# Patient Record
Sex: Male | Born: 1995 | Race: Black or African American | Hispanic: No | Marital: Single | Smoking: Former smoker
Health system: Southern US, Community
[De-identification: ages and names within clinical notes are randomized; demographics above are authoritative.]

## PROBLEM LIST (undated history)

## (undated) ENCOUNTER — Emergency Department (HOSPITAL_COMMUNITY): Discharge status: Absent without leave | Payer: Self-pay

## (undated) DIAGNOSIS — L0291 Cutaneous abscess, unspecified: Secondary | ICD-10-CM

## (undated) DIAGNOSIS — J45909 Unspecified asthma, uncomplicated: Secondary | ICD-10-CM

## (undated) HISTORY — PX: FRACTURE SURGERY: SHX138

---

## 2018-02-13 ENCOUNTER — Emergency Department (HOSPITAL_COMMUNITY)
Admission: EM | Admit: 2018-02-13 | Discharge: 2018-02-13 | Disposition: A | Discharge status: Home / Self Care | Payer: Self-pay | Attending: Emergency Medicine | Admitting: Emergency Medicine

## 2018-02-13 ENCOUNTER — Encounter (HOSPITAL_COMMUNITY): Payer: Self-pay | Admitting: Emergency Medicine

## 2018-02-13 DIAGNOSIS — F17290 Nicotine dependence, other tobacco product, uncomplicated: Secondary | ICD-10-CM | POA: Insufficient documentation

## 2018-02-13 DIAGNOSIS — J40 Bronchitis, not specified as acute or chronic: Secondary | ICD-10-CM | POA: Insufficient documentation

## 2018-02-13 HISTORY — DX: Unspecified asthma, uncomplicated: J45.909

## 2018-02-13 LAB — GROUP A STREP BY PCR: Group A Strep by PCR: NOT DETECTED

## 2018-02-13 MED ORDER — BENZONATATE 100 MG PO CAPS: 100.0000 mg | ORAL_CAPSULE | Freq: Three times a day (TID) | ORAL | 0 refills | Status: DC

## 2018-02-13 MED ORDER — ALBUTEROL SULFATE HFA 108 (90 BASE) MCG/ACT IN AERS
2.0000 | INHALATION_SPRAY | RESPIRATORY_TRACT | Status: DC | PRN
  Administered 2018-02-13: 2 via RESPIRATORY_TRACT
  Filled 2018-02-13: qty 6.7

## 2018-02-13 MED ORDER — AZITHROMYCIN 250 MG PO TABS: 250.0000 mg | ORAL_TABLET | Freq: Every day | ORAL | 0 refills | Status: DC

## 2018-02-13 NOTE — ED Triage Notes (Signed)
Pt c/o congestion, sore throat, cough with mucous since Monday.

## 2018-02-13 NOTE — ED Provider Notes (Signed)
[name]-EMERGENCY DEPT Provider Note   CSN: [id number] Arrival date & time: 02/13/18  1302     History   Chief Complaint Chief Complaint  Patient presents with  . Cough  . Sore Throat  . Nasal Congestion    HPI Drew [name] is a 22 y.o. male with hx of asthma and an every day smoker who presents to the ED with sore throat, cough and congestion that started 4 days ago. Patient reports the symptoms have gotten worse. Patient has taken OTC medications without relief.   The history is provided by the patient. No language interpreter was used.  Cough  This is a new problem. The current episode started more than 2 days ago. The problem has been gradually worsening. The cough is productive of sputum. There has been no fever. Associated symptoms include headaches, rhinorrhea, sore throat, myalgias and wheezing. Pertinent negatives include no chills, no ear pain, no shortness of breath and no eye redness. He has tried decongestants for the symptoms. He is a smoker. His past medical history is significant for asthma.  Sore Throat  This is a new problem. The current episode started more than 2 days ago. The problem occurs constantly. The problem has been gradually worsening. Associated symptoms include headaches. Pertinent negatives include no abdominal pain and no shortness of breath. The symptoms are aggravated by coughing, swallowing and eating. He has tried acetaminophen for the symptoms.    Past Medical History:  Diagnosis Date  . Asthma     There are no active problems to display for this patient.   Past Surgical History:  Procedure Laterality Date  . FRACTURE SURGERY          Home Medications    Prior to Admission medications   Medication Sig Start Date End Date Taking? Authorizing Provider  azithromycin (ZITHROMAX) 250 MG tablet Take 1 tablet (250 mg total) by mouth daily. Take first 2 tablets together, then 1 every day until finished.  02/13/18   Janne Napoleon, NP  benzonatate (TESSALON) 100 MG capsule Take 1 capsule (100 mg total) by mouth every 8 (eight) hours. 02/13/18   Janne Napoleon, NP    Family History No family history on file.  Social History Social History   Tobacco Use  . Smoking status: Current Every Day Smoker    Types: Cigars  . Smokeless tobacco: Never Used  Substance Use Topics  . Alcohol use: Not on file  . Drug use: Not on file     Allergies   Patient has no known allergies.   Review of Systems Review of Systems  Constitutional: Negative for chills.  HENT: Positive for congestion, rhinorrhea, sinus pressure and sore throat. Negative for ear pain and trouble swallowing.   Eyes: Negative for pain, discharge, redness and itching.  Respiratory: Positive for cough and wheezing. Negative for shortness of breath.   Gastrointestinal: Positive for constipation. Negative for abdominal pain, nausea and vomiting.  Genitourinary: Negative for decreased urine volume, dysuria, frequency and urgency.  Musculoskeletal: Positive for myalgias.  Skin: Negative for rash.  Neurological: Positive for headaches. Negative for syncope.  Hematological: Negative for adenopathy.  Psychiatric/Behavioral: Negative for confusion.     Physical Exam Updated Vital Signs BP 140/87 (BP Location: Right Arm)   Pulse 76   Temp 98.1 F (36.7 C) (Oral)   Resp 18   Ht 5' 6.5" (1.689 m)   Wt 70.3 kg (155 lb)   SpO2 100%   BMI 24.64 kg/m  Physical Exam  Constitutional: He appears well-developed and well-nourished. No distress.  HENT:  Head: Normocephalic and atraumatic.  Right Ear: Tympanic membrane normal.  Left Ear: Tympanic membrane normal.  Nose: Mucosal edema and rhinorrhea present. Right sinus exhibits maxillary sinus tenderness. Left sinus exhibits maxillary sinus tenderness.  Mouth/Throat: Uvula is midline and mucous membranes are normal. Posterior oropharyngeal erythema present. No posterior oropharyngeal  edema.  Eyes: Pupils are equal, round, and reactive to light. Conjunctivae and EOM are normal.  Neck: Normal range of motion. Neck supple.  Cardiovascular: Normal rate and regular rhythm.  Pulmonary/Chest: Effort normal. No respiratory distress. Wheezes: occcasional. He exhibits tenderness.  Abdominal: Soft. Bowel sounds are normal. There is no tenderness.  Musculoskeletal: Normal range of motion.  Lymphadenopathy:    He has cervical adenopathy.  Neurological: He is alert.  Skin: Skin is warm and dry.  Psychiatric: He has a normal mood and affect. His behavior is normal.  Nursing note and vitals reviewed.    ED Treatments / Results  Labs (all labs ordered are listed, but only abnormal results are displayed) Labs Reviewed  GROUP A STREP BY PCR    Radiology No results found.  Procedures Procedures (including critical care time)  Medications Ordered in ED Medications  albuterol (PROVENTIL HFA;VENTOLIN HFA) 108 (90 Base) MCG/ACT inhaler 2 puff (has no administration in time range)     Initial Impression / Assessment and Plan / ED Course  I have reviewed the triage vital signs and the nursing notes.  Patients symptoms are consistent with bronchitis. Pt will be discharged with Z-pak and symptomatic treatment.  Verbalizes understanding and is agreeable with plan. Pt is hemodynamically stable & in NAD prior to dc. Encouraged patient to stop smoking.  Final Clinical Impressions(s) / ED Diagnoses   Final diagnoses:  Bronchitis    ED Discharge Orders        Ordered    azithromycin (ZITHROMAX) 250 MG tablet  Daily     02/13/18 1402    benzonatate (TESSALON) 100 MG capsule  Every 8 hours     02/13/18 1402       Kerrie Buffaloeese, Salima Rumer GaltM, NP 02/13/18 1406    Raeford RazorKohut, Stephen, MD 02/16/18 1621

## 2018-04-28 ENCOUNTER — Emergency Department (HOSPITAL_COMMUNITY)
Admission: EM | Admit: 2018-04-28 | Discharge: 2018-04-29 | Disposition: A | Discharge status: Home / Self Care | Payer: Self-pay | Attending: Emergency Medicine | Admitting: Emergency Medicine

## 2018-04-28 ENCOUNTER — Encounter (HOSPITAL_COMMUNITY): Payer: Self-pay | Admitting: Emergency Medicine

## 2018-04-28 ENCOUNTER — Other Ambulatory Visit: Payer: Self-pay

## 2018-04-28 DIAGNOSIS — L0501 Pilonidal cyst with abscess: Secondary | ICD-10-CM | POA: Insufficient documentation

## 2018-04-28 DIAGNOSIS — J45909 Unspecified asthma, uncomplicated: Secondary | ICD-10-CM | POA: Insufficient documentation

## 2018-04-28 NOTE — ED Triage Notes (Signed)
Patient is complaining of abscess on lower back that started two days ago. Patient also states that he has a sharp pain that comes and goes on left leg.

## 2018-04-29 MED ORDER — LIDOCAINE-EPINEPHRINE (PF) 2 %-1:200000 IJ SOLN
10.0000 mL | Freq: Once | INTRAMUSCULAR | Status: AC
  Administered 2018-04-29: 10 mL
  Filled 2018-04-29: qty 20

## 2018-04-29 MED ORDER — OXYCODONE-ACETAMINOPHEN 5-325 MG PO TABS: 1.0000 | ORAL_TABLET | ORAL | 0 refills | Status: DC | PRN

## 2018-04-29 MED ORDER — OXYCODONE-ACETAMINOPHEN 5-325 MG PO TABS
1.0000 | ORAL_TABLET | Freq: Once | ORAL | Status: AC
  Administered 2018-04-29: 1 via ORAL
  Filled 2018-04-29: qty 1

## 2018-04-29 NOTE — ED Provider Notes (Signed)
[facility]-EMERGENCY DEPT Provider Note   CSN: [id number] Arrival date & time: 04/28/18  2134     History   Chief Complaint Chief Complaint  Patient presents with  . Abscess    HPI Drew [name] is a 22 y.o. male.  The history is provided by the patient and the spouse. No language interpreter was used.  Abscess  Location:  Pelvis Pelvic abscess location:  Gluteal cleft Abscess quality: fluctuance, induration, painful and redness   Abscess quality: not draining   Red streaking: no   Duration:  2 days Progression:  Worsening Chronicity:  Recurrent Relieved by:  Nothing Worsened by:  Nothing Associated symptoms: no fever and no nausea     Past Medical History:  Diagnosis Date  . Asthma     There are no active problems to display for this patient.   Past Surgical History:  Procedure Laterality Date  . FRACTURE SURGERY          Home Medications    Prior to Admission medications   Medication Sig Start Date End Date Taking? Authorizing Provider  azithromycin (ZITHROMAX) 250 MG tablet Take 1 tablet (250 mg total) by mouth daily. Take first 2 tablets together, then 1 every day until finished. Patient not taking: Reported on 04/29/2018 02/13/18   Janne Napoleon, NP  benzonatate (TESSALON) 100 MG capsule Take 1 capsule (100 mg total) by mouth every 8 (eight) hours. Patient not taking: Reported on 04/29/2018 02/13/18   Janne Napoleon, NP    Family History History reviewed. No pertinent family history.  Social History Social History   Tobacco Use  . Smoking status: Current Every Day Smoker    Types: Cigars  . Smokeless tobacco: Never Used  Substance Use Topics  . Alcohol use: Not Currently  . Drug use: Not Currently     Allergies   Dairy aid [lactase] and Silver   Review of Systems Review of Systems  Constitutional: Negative for fever.  Gastrointestinal: Negative for nausea.  Musculoskeletal: Positive for back pain.  Skin:  Positive for wound.     Physical Exam Updated Vital Signs BP 124/89 (BP Location: Left Arm)   Pulse 91   Temp 98.2 F (36.8 C) (Oral)   Resp 18   Ht 5' 6.5" (1.689 m)   Wt 72.6 kg   SpO2 100%   BMI 25.44 kg/m   Physical Exam  Constitutional: He is oriented to person, place, and time. He appears well-developed and well-nourished. No distress.  Pulmonary/Chest: Effort normal.  Abdominal: There is no tenderness.  Musculoskeletal: Normal range of motion.  Neurological: He is alert and oriented to person, place, and time.  Skin: There is erythema (Localized to pilonidal abscess).     Swelling, induration, redness and fluctuance at gluteal cleft c/w pilonidal abscess.      ED Treatments / Results  Labs (all labs ordered are listed, but only abnormal results are displayed) Labs Reviewed - No data to display  EKG None  Radiology No results found.  Procedures .Marland KitchenIncision and Drainage Date/Time: 04/29/2018 3:11 AM Performed by: Elpidio Anis, PA-C Authorized by: Elpidio Anis, PA-C   Consent:    Consent obtained:  Verbal   Consent given by:  Patient Location:    Type:  Pilonidal cyst Pre-procedure details:    Skin preparation:  Betadine Procedure type:    Complexity:  Simple Procedure details:    Needle aspiration: no     Incision types:  Single straight   Incision depth:  Subcutaneous   Scalpel blade:  11   Wound management:  Probed and deloculated   Drainage:  Bloody and purulent   Drainage amount:  Copious   Wound treatment:  Wound left open   Packing materials:  1/2 in gauze Post-procedure details:    Patient tolerance of procedure:  Tolerated well, no immediate complications   (including critical care time)  Medications Ordered in ED Medications  lidocaine-EPINEPHrine (XYLOCAINE W/EPI) 2 %-1:200000 (PF) injection 10 mL (10 mLs Infiltration Given by Other 04/29/18 0217)  oxyCODONE-acetaminophen (PERCOCET/ROXICET) 5-325 MG per tablet 1 tablet (1 tablet  Oral Given 04/29/18 0215)     Initial Impression / Assessment and Plan / ED Course  I have reviewed the triage vital signs and the nursing notes.  Pertinent labs & imaging results that were available during my care of the patient were reviewed by me and considered in my medical decision making (see chart for details).     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 I&D successful for copious drainage, relief of pain and packing placed. Will return in 2 days for recheck and packing removal, sooner with any worsening symptoms.   Final Clinical Impressions(s) / ED Diagnoses   Final diagnoses:  None   1. Recurrent pilonidal abscess  ED Discharge Orders    None       Elpidio Anis, PA-C 04/29/18 9622    Derwood Kaplan, MD 04/30/18 [phone]

## 2018-04-30 ENCOUNTER — Other Ambulatory Visit: Payer: Self-pay

## 2018-04-30 ENCOUNTER — Encounter (HOSPITAL_COMMUNITY): Payer: Self-pay

## 2018-04-30 ENCOUNTER — Emergency Department (HOSPITAL_COMMUNITY)
Admission: EM | Admit: 2018-04-30 | Discharge: 2018-04-30 | Disposition: A | Discharge status: Home / Self Care | Payer: Self-pay | Attending: Emergency Medicine | Admitting: Emergency Medicine

## 2018-04-30 DIAGNOSIS — Z09 Encounter for follow-up examination after completed treatment for conditions other than malignant neoplasm: Secondary | ICD-10-CM | POA: Insufficient documentation

## 2018-04-30 DIAGNOSIS — L0501 Pilonidal cyst with abscess: Secondary | ICD-10-CM | POA: Insufficient documentation

## 2018-04-30 DIAGNOSIS — F17290 Nicotine dependence, other tobacco product, uncomplicated: Secondary | ICD-10-CM | POA: Insufficient documentation

## 2018-04-30 DIAGNOSIS — J45909 Unspecified asthma, uncomplicated: Secondary | ICD-10-CM | POA: Insufficient documentation

## 2018-04-30 MED ORDER — HYDROCODONE-ACETAMINOPHEN 5-325 MG PO TABS
1.0000 | ORAL_TABLET | Freq: Once | ORAL | Status: AC
  Administered 2018-04-30: 1 via ORAL
  Filled 2018-04-30: qty 1

## 2018-04-30 MED ORDER — SULFAMETHOXAZOLE-TRIMETHOPRIM 800-160 MG PO TABS
1.0000 | ORAL_TABLET | Freq: Once | ORAL | Status: AC
  Administered 2018-04-30: 1 via ORAL
  Filled 2018-04-30: qty 1

## 2018-04-30 MED ORDER — SULFAMETHOXAZOLE-TRIMETHOPRIM 800-160 MG PO TABS: 1.0000 | ORAL_TABLET | Freq: Two times a day (BID) | ORAL | 0 refills | Status: AC

## 2018-04-30 NOTE — ED Provider Notes (Signed)
[facility]-EMERGENCY DEPT Provider Note   CSN: [id number] Arrival date & time: 04/30/18  1504     History   Chief Complaint Chief Complaint  Patient presents with  . Abscess    HPI Drew [name] is a 22 y.o. male who presents to the ED for wound check. Patient reports that he had I&D of an abscess yesterday and today the area is very painful and the drainage is coming through the bandage. Patient denies fever or chills.   HPI  Past Medical History:  Diagnosis Date  . Asthma     There are no active problems to display for this patient.   Past Surgical History:  Procedure Laterality Date  . FRACTURE SURGERY          Home Medications    Prior to Admission medications   Medication Sig Start Date End Date Taking? Authorizing Provider  azithromycin (ZITHROMAX) 250 MG tablet Take 1 tablet (250 mg total) by mouth daily. Take first 2 tablets together, then 1 every day until finished. Patient not taking: Reported on 04/29/2018 02/13/18   Janne Napoleon, NP  benzonatate (TESSALON) 100 MG capsule Take 1 capsule (100 mg total) by mouth every 8 (eight) hours. Patient not taking: Reported on 04/29/2018 02/13/18   Janne Napoleon, NP  oxyCODONE-acetaminophen (PERCOCET/ROXICET) 5-325 MG tablet Take 1 tablet by mouth every 4 (four) hours as needed for severe pain. 04/29/18   Elpidio Anis, PA-C  sulfamethoxazole-trimethoprim (BACTRIM DS,SEPTRA DS) 800-160 MG tablet Take 1 tablet by mouth 2 (two) times daily for 7 days. 04/30/18 05/07/18  Janne Napoleon, NP    Family History Family History  Adopted: Yes    Social History Social History   Tobacco Use  . Smoking status: Light Tobacco Smoker    Types: Cigars  . Smokeless tobacco: Never Used  Substance Use Topics  . Alcohol use: Not Currently  . Drug use: Not Currently     Allergies   Dairy aid [lactase]; Silver; and Chocolate   Review of Systems Review of Systems  Skin: Positive for wound.       Pain  and drainage   All other systems reviewed and are negative.    Physical Exam Updated Vital Signs BP (!) 139/98 (BP Location: Right Arm)   Pulse 87   Temp 98.9 F (37.2 C) (Oral)   Resp 14   Ht 5' 6.5" (1.689 m)   Wt 72.5 kg   SpO2 99%   BMI 25.41 kg/m   Physical Exam  Constitutional: He appears well-developed and well-nourished. No distress.  HENT:  Head: Normocephalic.  Eyes: Conjunctivae and EOM are normal.  Neck: Neck supple.  Cardiovascular: Normal rate.  Pulmonary/Chest: Effort normal.  Musculoskeletal: Normal range of motion.  Neurological: He is alert.  Skin: Skin is warm and dry.  Large amount of drainage from the incision site of pilonidal cyst. Packing in place. There is an area of redness above the wound approximately 2 cm. The area is tender with palpation.  Psychiatric: He has a normal mood and affect.  Nursing note and vitals reviewed.    ED Treatments / Results  Labs (all labs ordered are listed, but only abnormal results are displayed) Labs Reviewed - No data to display Radiology No results found.  Procedures: area surrounding the abscess cleaned and dressing applied.  Procedures (including critical care time)  Medications Ordered in ED Medications  HYDROcodone-acetaminophen (NORCO/VICODIN) 5-325 MG per tablet 1 tablet (has no administration in time range)  sulfamethoxazole-trimethoprim (BACTRIM DS,SEPTRA DS) 800-160 MG per tablet 1 tablet (has no administration in time range)     Initial Impression / Assessment and Plan / ED Course  I have reviewed the triage vital signs and the nursing notes. 22 y.o. male here for wound recheck and increased pain and drainage and area of redness stable for d/c without fever and does not appear toxic. Will start antibiotics due to the area of erythema. Patient will return for packing removal as scheduled.   Final Clinical Impressions(s) / ED Diagnoses   Final diagnoses:  Encounter for recheck of abscess  following incision and drainage    ED Discharge Orders         Ordered    sulfamethoxazole-trimethoprim (BACTRIM DS,SEPTRA DS) 800-160 MG tablet  2 times daily     04/30/18 1800           Damian Leavell [place], NP 04/30/18 1810    Melene Plan, DO 04/30/18 2301

## 2018-04-30 NOTE — ED Triage Notes (Signed)
Patient reports that he had an abscess drained from the tailbone yesterday and today the drainage is worse and has a foul odor. Patient also reports that he was prescribed a pain med but can not get it filled until tomorrow due to no money. Patient also c/o left leg pain which he had yesterday.

## 2018-04-30 NOTE — Discharge Instructions (Addendum)
Take Advil in addition to the medications we give you. Return as scheduled for packing removal and recheck.

## 2018-05-01 ENCOUNTER — Other Ambulatory Visit: Payer: Self-pay

## 2018-05-01 ENCOUNTER — Emergency Department (HOSPITAL_COMMUNITY)
Admission: EM | Admit: 2018-05-01 | Discharge: 2018-05-01 | Disposition: A | Discharge status: Home / Self Care | Payer: Self-pay | Attending: Emergency Medicine | Admitting: Emergency Medicine

## 2018-05-01 ENCOUNTER — Encounter (HOSPITAL_COMMUNITY): Payer: Self-pay | Admitting: Emergency Medicine

## 2018-05-01 DIAGNOSIS — R030 Elevated blood-pressure reading, without diagnosis of hypertension: Secondary | ICD-10-CM | POA: Insufficient documentation

## 2018-05-01 DIAGNOSIS — F17290 Nicotine dependence, other tobacco product, uncomplicated: Secondary | ICD-10-CM | POA: Insufficient documentation

## 2018-05-01 DIAGNOSIS — Z5189 Encounter for other specified aftercare: Secondary | ICD-10-CM

## 2018-05-01 DIAGNOSIS — L0501 Pilonidal cyst with abscess: Secondary | ICD-10-CM | POA: Insufficient documentation

## 2018-05-01 MED ORDER — LIDOCAINE-EPINEPHRINE (PF) 2 %-1:200000 IJ SOLN
20.0000 mL | Freq: Once | INTRAMUSCULAR | Status: AC
  Administered 2018-05-01: 20 mL
  Filled 2018-05-01: qty 20

## 2018-05-01 NOTE — ED Triage Notes (Signed)
Pt wants to get abscess in buttocks checked. Thinks it needs to be drained again. Unsure if had anymore drainage since last night when wife last changed his dressing. Reports still having pain. Pt wouldn't sit while being triaged.

## 2018-05-01 NOTE — ED Provider Notes (Signed)
[facility]-EMERGENCY DEPT Provider Note   CSN: [id number] Arrival date & time: 05/01/18  1738     History   Chief Complaint Chief Complaint  Patient presents with  . Abscess    HPI Karen Sotto is a 22 y.o. male.  HPI  Patient is a 22 year old male with a history of asthma presenting for wound check.  Patient had an incision and drainage of a pilonidal abscess on 9- 3- 2019, was prescribed antibiotics on 04-30-2018, and presents for recheck today.  Patient reporting sensation of increased pressure and pain around the area, and there has been minimal drainage.  Patient denies doing warm compresses, but does report he is taking 1 dose of the antibiotic.  Patient denies any fevers, chills, or spreading erythema around the site.  Past Medical History:  Diagnosis Date  . Asthma     There are no active problems to display for this patient.   Past Surgical History:  Procedure Laterality Date  . FRACTURE SURGERY          Home Medications    Prior to Admission medications   Medication Sig Start Date End Date Taking? Authorizing Provider  azithromycin (ZITHROMAX) 250 MG tablet Take 1 tablet (250 mg total) by mouth daily. Take first 2 tablets together, then 1 every day until finished. Patient not taking: Reported on 04/29/2018 02/13/18   Janne Napoleon, NP  benzonatate (TESSALON) 100 MG capsule Take 1 capsule (100 mg total) by mouth every 8 (eight) hours. Patient not taking: Reported on 04/29/2018 02/13/18   Janne Napoleon, NP  oxyCODONE-acetaminophen (PERCOCET/ROXICET) 5-325 MG tablet Take 1 tablet by mouth every 4 (four) hours as needed for severe pain. 04/29/18   Elpidio Anis, PA-C  sulfamethoxazole-trimethoprim (BACTRIM DS,SEPTRA DS) 800-160 MG tablet Take 1 tablet by mouth 2 (two) times daily for 7 days. 04/30/18 05/07/18  Janne Napoleon, NP    Family History Family History  Adopted: Yes    Social History Social History   Tobacco Use  . Smoking  status: Light Tobacco Smoker    Types: Cigars  . Smokeless tobacco: Never Used  Substance Use Topics  . Alcohol use: Not Currently  . Drug use: Not Currently     Allergies   Dairy aid [lactase]; Silver; and Chocolate   Review of Systems Review of Systems  Constitutional: Negative for chills and fever.  Skin: Positive for wound. Negative for color change.     Physical Exam Updated Vital Signs BP (!) 144/102 (BP Location: Right Arm)   Pulse 92   Temp 98.5 F (36.9 C) (Oral)   Resp 16   Ht 5' 6.5" (1.689 m)   Wt 72.6 kg   SpO2 99%   BMI 25.44 kg/m   Physical Exam  Constitutional: He appears well-developed and well-nourished. No distress.  Sitting comfortably in bed.  HENT:  Head: Normocephalic and atraumatic.  Eyes: Conjunctivae are normal. Right eye exhibits no discharge. Left eye exhibits no discharge.  EOMs normal to gross examination.  Neck: Normal range of motion.  Cardiovascular: Normal rate and regular rhythm.  Intact, 2+ radial pulse.  Pulmonary/Chest:  Normal respiratory effort. Patient converses comfortably. No audible wheeze or stridor.  Abdominal: He exhibits no distension.  Genitourinary:  Genitourinary Comments: Examination performed with RN chaperone present.  There is a well-healing incision with packing extruding from the incision just to the right of midline of the gluteal cleft.  Small amount of purulent drainage coming from the packing.  Musculoskeletal:  Normal range of motion.  Neurological: He is alert.  Cranial nerves intact to gross observation. Patient moves extremities without difficulty.  Skin: Skin is warm and dry. He is not diaphoretic.  Psychiatric: He has a normal mood and affect. His behavior is normal. Judgment and thought content normal.  Nursing note and vitals reviewed.    ED Treatments / Results  Labs (all labs ordered are listed, but only abnormal results are displayed) Labs Reviewed - No data to  display  EKG None  Radiology No results found.  Procedures Irrigation Date/Time: 05/01/2018 9:39 PM Performed by: Elisha Ponder, PA-C Authorized by: Elisha Ponder, PA-C  Consent: Verbal consent obtained. Risks and benefits: risks, benefits and alternatives were discussed Consent given by: patient Patient understanding: patient states understanding of the procedure being performed Required items: required blood products, implants, devices, and special equipment available Patient identity confirmed: verbally with patient Local anesthesia used: yes Anesthesia: local infiltration  Anesthesia: Local anesthesia used: yes Local Anesthetic: lidocaine 1% with epinephrine  Sedation: Patient sedated: no  Patient tolerance: Patient tolerated the procedure well with no immediate complications Comments: Incision was extended to 1 cm after local anesthesia.  Packing was removed, wound was irrigated with normal saline.  Patient had blood clot as well as serous fluid expressed, but minimal purulence.  Wound repacked. Assisted by Rene Kocher, nurse tech as chaperone.    (including critical care time)  Medications Ordered in ED Medications  lidocaine-EPINEPHrine (XYLOCAINE W/EPI) 2 %-1:200000 (PF) injection 20 mL (20 mLs Infiltration Given 05/01/18 1858)     Initial Impression / Assessment and Plan / ED Course  I have reviewed the triage vital signs and the nursing notes.  Pertinent labs & imaging results that were available during my care of the patient were reviewed by me and considered in my medical decision making (see chart for details).     Patient nontoxic-appearing, afebrile, in no acute distress.  Patient does not appear to have expanding cellulitis from his pilonidal abscess.  Patient did have what appeared to be seroma after removal of the packing.  Wound was repacked and recommended patient follow-up for recheck in 48 hours.  Patient instructed to continue taking his  antibiotics.  Return precautions were given increasing pain or redness spreading from the site.  Patient is in understanding and agrees with the plan of care.  Final Clinical Impressions(s) / ED Diagnoses   Final diagnoses:  Pilonidal abscess  Wound check, abscess    ED Discharge Orders    None       Delia Chimes 05/01/18 2142    Pricilla Loveless, MD 05/01/18 [phone]

## 2018-05-01 NOTE — Discharge Instructions (Signed)
Please see the information and instructions below regarding your visit.  Your diagnoses today include:  1. Pilonidal abscess   2. Wound check, abscess   3. Elevated blood pressure reading without diagnosis of hypertension     Abscesses form when an infection in your skin starts to collect bacteria and white blood cells, walling it off from the rest of your body to protect you from a bigger infection. Risk factors for this type of infection include:  ?Break in the skin ?Diabetes ?Swollen areas  Sometimes the infection starts to spread to surrounding tissue, causing redness and swelling. We call this cellulitis.   Tests performed today include: See side panel of your discharge paperwork for testing performed today. Vital signs are listed at the bottom of these instructions.   Medications prescribed:    Take any prescribed medications only as prescribed, and any over the counter medications only as directed on the packaging.  Continue your antibiotic.  Home care instructions:  Please follow any educational materials contained in this packet.   Some things that may promote healing of your wound and infection include:  Perform a sitz bath, where you sit in a clean tub of warm water for approximately 20 minutes 3 times daily. Keep the infected area clean and dry. You can take a shower or bath, but be sure to pat the area dry with a towel afterward. Do not put any antibiotic ointments or creams on the area. Reapply a dry gauze dressing any time the bandage has become soaked with drainage, or after cleansing the wound.  Apply warm compresses to the wound 3-4 times daily to encourage drainage.   Return instructions:  Please return to the Emergency Department if you experience worsening symptoms. You should return for reevaluation of your infection if you notice spreading redness, increased swelling, an abscess develops, or you develop signs and symptoms of a systemic illness such as  fever and chills.  Please return if you have any other emergent concerns.  Additional Information:   Your vital signs today were: BP (!) 144/102 (BP Location: Right Arm)    Pulse 92    Temp 98.5 F (36.9 C) (Oral)    Resp 16    Ht 5' 6.5" (1.689 m)    Wt 72.6 kg    SpO2 99%    BMI 25.44 kg/m  If your blood pressure (BP) was elevated on multiple readings during this visit above 130 for the top number or above 80 for the bottom number, please have this repeated by your primary care provider within one month. --------------  Thank you for allowing [place] to participate in your care today.

## 2018-05-03 ENCOUNTER — Other Ambulatory Visit: Payer: Self-pay

## 2018-05-03 ENCOUNTER — Emergency Department (HOSPITAL_COMMUNITY)
Admission: EM | Admit: 2018-05-03 | Discharge: 2018-05-03 | Disposition: A | Discharge status: Home / Self Care | Payer: Self-pay | Attending: Emergency Medicine | Admitting: Emergency Medicine

## 2018-05-03 ENCOUNTER — Encounter (HOSPITAL_COMMUNITY): Payer: Self-pay | Admitting: Emergency Medicine

## 2018-05-03 DIAGNOSIS — Z09 Encounter for follow-up examination after completed treatment for conditions other than malignant neoplasm: Secondary | ICD-10-CM

## 2018-05-03 DIAGNOSIS — Z4800 Encounter for change or removal of nonsurgical wound dressing: Secondary | ICD-10-CM | POA: Insufficient documentation

## 2018-05-03 DIAGNOSIS — J45909 Unspecified asthma, uncomplicated: Secondary | ICD-10-CM | POA: Insufficient documentation

## 2018-05-03 DIAGNOSIS — F17210 Nicotine dependence, cigarettes, uncomplicated: Secondary | ICD-10-CM | POA: Insufficient documentation

## 2018-05-03 HISTORY — DX: Cutaneous abscess, unspecified: L02.91

## 2018-05-03 NOTE — Discharge Instructions (Signed)
Continue taking the antibiotic, until finished.  Antiinflammatory medications: Take 600 mg of ibuprofen every 6 hours or 440 mg (over the counter dose) to 500 mg (prescription dose) of naproxen every 12 hours for the next 3 days. After this time, these medications may be used as needed for pain. Take these medications with food to avoid upset stomach. Choose only one of these medications, do not take them together. Acetaminophen (generic for Tylenol): Should you continue to have additional pain while taking the ibuprofen or naproxen, you may add in acetaminophen as needed. Your daily total maximum amount of acetaminophen from all sources should be limited to 4000mg /day for persons without liver problems, or 2000mg /day for those with liver problems.  May apply warm or cool compresses, depending on what feels the best at this point.  Follow-up with general surgery for definitive management of this issue.

## 2018-05-03 NOTE — ED Triage Notes (Addendum)
Pt reports he has a abscess on the buttocks, he had is lanced at [facility] on Weds and Friday. He arrives today stating the packing came out when his wife changed the dressing earlier today. Gauze with serosanguinous drainage noted. Pt also requesting a new prescription for pain pill.

## 2018-05-03 NOTE — ED Provider Notes (Signed)
[facility] EMERGENCY DEPARTMENT Provider Note   CSN: [id number] Arrival date & time: 05/03/18  1522     History   Chief Complaint Chief Complaint  Patient presents with  . Abscess    HPI Drew [name] is a 22 y.o. male.  HPI   Drew [name] is a 22 y.o. male, with a history of asthma, presenting to the ED for recheck of pilonidal abscess.  Abscess was originally drained in the ED on September 3.  He was started on Bactrim on September 5 and states he has been compliant.  On September 6, he was still having sensation of pressure and increased pain, as well as purulent discharge.  His wound was irrigated and repacked. He presents today because he was told to follow-up in 2 days. He still has pain in the region, but no swelling and discharge has resolved.  Symptoms have overall improved. Denies fever/chills, spreading pain, spreading redness, abdominal pain, or any other complaints.   Past Medical History:  Diagnosis Date  . Abscess   . Asthma     There are no active problems to display for this patient.   Past Surgical History:  Procedure Laterality Date  . FRACTURE SURGERY          Home Medications    Prior to Admission medications   Medication Sig Start Date End Date Taking? Authorizing Provider  azithromycin (ZITHROMAX) 250 MG tablet Take 1 tablet (250 mg total) by mouth daily. Take first 2 tablets together, then 1 every day until finished. Patient not taking: Reported on 04/29/2018 02/13/18   Janne Napoleon, NP  benzonatate (TESSALON) 100 MG capsule Take 1 capsule (100 mg total) by mouth every 8 (eight) hours. Patient not taking: Reported on 04/29/2018 02/13/18   Janne Napoleon, NP  oxyCODONE-acetaminophen (PERCOCET/ROXICET) 5-325 MG tablet Take 1 tablet by mouth every 4 (four) hours as needed for severe pain. 04/29/18   Elpidio Anis, PA-C  sulfamethoxazole-trimethoprim (BACTRIM DS,SEPTRA DS) 800-160 MG tablet Take 1 tablet by mouth 2  (two) times daily for 7 days. 04/30/18 05/07/18  Janne Napoleon, NP    Family History Family History  Adopted: Yes    Social History Social History   Tobacco Use  . Smoking status: Light Tobacco Smoker    Types: Cigars  . Smokeless tobacco: Never Used  Substance Use Topics  . Alcohol use: Not Currently  . Drug use: Not Currently     Allergies   Dairy aid [lactase]; Silver; and Chocolate   Review of Systems Review of Systems  Constitutional: Negative for chills and fever.  Gastrointestinal: Negative for abdominal pain, nausea and vomiting.  Skin: Positive for wound.     Physical Exam Updated Vital Signs BP 132/87 (BP Location: Right Arm)   Pulse 81   Temp 98.7 F (37.1 C) (Oral)   Resp 20   SpO2 99%   Physical Exam  Constitutional: He appears well-developed and well-nourished. No distress.  HENT:  Head: Normocephalic and atraumatic.  Eyes: Conjunctivae are normal.  Neck: Neck supple.  Cardiovascular: Normal rate and regular rhythm.  Pulmonary/Chest: Effort normal.  Musculoskeletal:       Back:  Neurological: He is alert.  Skin: Skin is warm and dry. He is not diaphoretic. No pallor.  Psychiatric: He has a normal mood and affect. His behavior is normal.  Nursing note and vitals reviewed.    ED Treatments / Results  Labs (all labs ordered are listed, but only abnormal results are  displayed) Labs Reviewed - No data to display  EKG None  Radiology No results found.  Procedures Procedures (including critical care time)  Medications Ordered in ED Medications - No data to display   Initial Impression / Assessment and Plan / ED Course  I have reviewed the triage vital signs and the nursing notes.  Pertinent labs & imaging results that were available during my care of the patient were reviewed by me and considered in my medical decision making (see chart for details).     Patient presents for recheck of I&D site.  He has some tenderness in the  region, but no active discharge.  No evidence of cellulitis.  No fluctuance or swelling to suggest recurrence of the abscess.  Since this is the second abscess patient has had in the region, instructions for general surgery follow-up were reiterated.    Final Clinical Impressions(s) / ED Diagnoses   Final diagnoses:  Encounter for recheck of abscess following incision and drainage    ED Discharge Orders    None       Concepcion Living 05/03/18 1702    Linwood Dibbles, MD 05/05/18 [phone]

## 2018-05-03 NOTE — ED Notes (Signed)
Patient able to ambulate independently  

## 2018-08-28 ENCOUNTER — Other Ambulatory Visit: Payer: Self-pay

## 2018-08-28 ENCOUNTER — Encounter (HOSPITAL_COMMUNITY): Payer: Self-pay | Admitting: Emergency Medicine

## 2018-08-28 ENCOUNTER — Emergency Department (HOSPITAL_COMMUNITY)
Admission: EM | Admit: 2018-08-28 | Discharge: 2018-08-28 | Disposition: A | Discharge status: Home / Self Care | Payer: Self-pay | Attending: Emergency Medicine | Admitting: Emergency Medicine

## 2018-08-28 DIAGNOSIS — F17210 Nicotine dependence, cigarettes, uncomplicated: Secondary | ICD-10-CM | POA: Insufficient documentation

## 2018-08-28 DIAGNOSIS — L0501 Pilonidal cyst with abscess: Secondary | ICD-10-CM | POA: Insufficient documentation

## 2018-08-28 DIAGNOSIS — J45909 Unspecified asthma, uncomplicated: Secondary | ICD-10-CM | POA: Insufficient documentation

## 2018-08-28 MED ORDER — LIDOCAINE-EPINEPHRINE (PF) 2 %-1:200000 IJ SOLN
10.0000 mL | Freq: Once | INTRAMUSCULAR | Status: AC
  Administered 2018-08-28: 10 mL
  Filled 2018-08-28: qty 20

## 2018-08-28 MED ORDER — SULFAMETHOXAZOLE-TRIMETHOPRIM 800-160 MG PO TABS: 1.0000 | ORAL_TABLET | Freq: Two times a day (BID) | ORAL | 0 refills | Status: AC

## 2018-08-28 MED ORDER — OXYCODONE-ACETAMINOPHEN 5-325 MG PO TABS
1.0000 | ORAL_TABLET | Freq: Once | ORAL | Status: AC
  Administered 2018-08-28: 1 via ORAL
  Filled 2018-08-28: qty 1

## 2018-08-28 NOTE — ED Triage Notes (Signed)
C/o pilonidal abscess x 3 weeks.  Denies fever/chills.  History of same.

## 2018-08-28 NOTE — ED Provider Notes (Signed)
[facility] EMERGENCY DEPARTMENT Provider Note   CSN: [id number] Arrival date & time: 08/28/18  1840     History   Chief Complaint Chief Complaint  Patient presents with  . Abscess    HPI Drew [name] is a 23 y.o. male with past medical history of asthma, pilonidal abscess, presenting to the emergency department with worsening pilonidal abscess x3 weeks.  Patient states he had the last abscess drained in September with resolution, however a few weeks ago he started noticing some soreness to that area.  He has had worsening redness and swelling to the area.  Has not taken any medications for his symptoms.  Denies associated fevers or chills.  It has not drained.  The history is provided by the patient.    Past Medical History:  Diagnosis Date  . Abscess   . Asthma     There are no active problems to display for this patient.   Past Surgical History:  Procedure Laterality Date  . FRACTURE SURGERY          Home Medications    Prior to Admission medications   Medication Sig Start Date End Date Taking? Authorizing Provider  azithromycin (ZITHROMAX) 250 MG tablet Take 1 tablet (250 mg total) by mouth daily. Take first 2 tablets together, then 1 every day until finished. Patient not taking: Reported on 04/29/2018 02/13/18   Janne Napoleon, NP  benzonatate (TESSALON) 100 MG capsule Take 1 capsule (100 mg total) by mouth every 8 (eight) hours. Patient not taking: Reported on 04/29/2018 02/13/18   Janne Napoleon, NP  oxyCODONE-acetaminophen (PERCOCET/ROXICET) 5-325 MG tablet Take 1 tablet by mouth every 4 (four) hours as needed for severe pain. 04/29/18   Elpidio Anis, PA-C  sulfamethoxazole-trimethoprim (BACTRIM DS,SEPTRA DS) 800-160 MG tablet Take 1 tablet by mouth 2 (two) times daily for 7 days. 08/28/18 09/04/18  Eveleen Mcnear, [place] N, PA-C    Family History Family History  Adopted: Yes    Social History Social History   Tobacco Use  . Smoking status:  Light Tobacco Smoker    Types: Cigars, Cigarettes  . Smokeless tobacco: Never Used  Substance Use Topics  . Alcohol use: Not Currently  . Drug use: Not Currently     Allergies   Dairy aid [lactase]; Silver; and Chocolate   Review of Systems Review of Systems  Constitutional: Negative for chills and fever.  Skin: Positive for color change.     Physical Exam Updated Vital Signs BP (!) 143/92 (BP Location: Right Arm)   Pulse 77   Temp 98.2 F (36.8 C) (Oral)   Resp 18   SpO2 100%   Physical Exam Vitals signs and nursing note reviewed.  Constitutional:      Appearance: He is well-developed. He is not ill-appearing or toxic-appearing.  HENT:     Head: Normocephalic and atraumatic.  Eyes:     Conjunctiva/sclera: Conjunctivae normal.  Cardiovascular:     Rate and Rhythm: Normal rate.  Pulmonary:     Effort: Pulmonary effort is normal.  Skin:    Comments: Erythematous indurated area about 4inches in diameter, in the center is ~3 inch area of fluctuance just above the gluteal cleft, right of the midline. Fluctuance tracts towards the gluteal cleft, right of the midline. Area is very tender. Not actively draining.   Neurological:     Mental Status: He is alert.  Psychiatric:        Mood and Affect: Mood normal.  Behavior: Behavior normal.      ED Treatments / Results  Labs (all labs ordered are listed, but only abnormal results are displayed) Labs Reviewed - No data to display  EKG None  Radiology No results found.  Procedures .Marland Kitchen.Incision and Drainage Date/Time: 08/28/2018 11:07 PM Performed by: Florence Yeung, [place] N, PA-C Authorized by: Wilmina Maxham, [place] N, PA-C   Consent:    Consent obtained:  Verbal   Consent given by:  Patient   Risks discussed:  Bleeding, incomplete drainage, infection and pain   Alternatives discussed:  No treatment Location:    Type:  Pilonidal cyst (pilonidal abscess)   Size:  3"   Location:  Anogenital   Anogenital location:   Pilonidal Pre-procedure details:    Skin preparation:  Chloraprep Anesthesia (see MAR for exact dosages):    Anesthesia method:  Local infiltration   Local anesthetic:  Lidocaine 2% WITH epi Procedure type:    Complexity:  Simple Procedure details:    Needle aspiration: no     Incision types:  Single straight   Incision depth:  Dermal   Scalpel blade:  11   Wound management:  Probed and deloculated and irrigated with saline   Drainage:  Purulent and bloody   Drainage amount:  Copious   Wound treatment:  Wound left open   Packing materials:  1/4 in iodoform gauze Post-procedure details:    Patient tolerance of procedure:  Tolerated well, no immediate complications Comments:     Repacked with 1/4 inch idoform gauze (1/2 inch gauze was not available at this time)   (including critical care time)  Medications Ordered in ED Medications  oxyCODONE-acetaminophen (PERCOCET/ROXICET) 5-325 MG per tablet 1 tablet (1 tablet Oral Given 08/28/18 2131)  lidocaine-EPINEPHrine (XYLOCAINE W/EPI) 2 %-1:200000 (PF) injection 10 mL (10 mLs Infiltration Given 08/28/18 2147)     Initial Impression / Assessment and Plan / ED Course  I have reviewed the triage vital signs and the nursing notes.  Pertinent labs & imaging results that were available during my care of the patient were reviewed by me and considered in my medical decision making (see chart for details).     Patient with pilonidal abscess amenable to incision and drainage.  Abscess was packed with 1/4 inch iodoform (1/2" not available at this time).  No systemic symptoms, no fever or chills. Wound recheck and packing removal in 2 days. Encouraged home warm compresses.  Given surrounding cellulitis, will discharge with Bactrim.  Will d/c to home.    Discussed results, findings, treatment and follow up. Patient advised of return precautions. Patient verbalized understanding and agreed with plan.  Final Clinical Impressions(s) / ED Diagnoses     Final diagnoses:  Pilonidal abscess    ED Discharge Orders         Ordered    sulfamethoxazole-trimethoprim (BACTRIM DS,SEPTRA DS) 800-160 MG tablet  2 times daily     08/28/18 2259           Brick Ketcher, [place] N, PA-C 08/28/18 2310    Virgina Norfolkuratolo, Adam, DO 08/29/18 [phone]

## 2018-08-28 NOTE — Discharge Instructions (Addendum)
Return to the ED in 2 days for wound recheck and packing removal.  Take the antibiotics as prescribed until gone. Apply warm compresses to your wound multiple times per day. Do not soak it, since you have packing in place. Take ibuprofen every 6 hours as needed for pain.  Return sooner for fever, chills, or new or worsening symptoms. Schedule an appointment with the surgeon to discuss definitive treatment.

## 2018-08-30 ENCOUNTER — Other Ambulatory Visit: Payer: Self-pay

## 2018-08-30 ENCOUNTER — Encounter (HOSPITAL_COMMUNITY): Payer: Self-pay

## 2018-08-30 ENCOUNTER — Emergency Department (HOSPITAL_COMMUNITY)
Admission: EM | Admit: 2018-08-30 | Discharge: 2018-08-30 | Disposition: A | Discharge status: Home / Self Care | Payer: Self-pay | Attending: Emergency Medicine | Admitting: Emergency Medicine

## 2018-08-30 DIAGNOSIS — Z5189 Encounter for other specified aftercare: Secondary | ICD-10-CM

## 2018-08-30 DIAGNOSIS — Z4800 Encounter for change or removal of nonsurgical wound dressing: Secondary | ICD-10-CM | POA: Insufficient documentation

## 2018-08-30 DIAGNOSIS — J45909 Unspecified asthma, uncomplicated: Secondary | ICD-10-CM | POA: Insufficient documentation

## 2018-08-30 DIAGNOSIS — F17210 Nicotine dependence, cigarettes, uncomplicated: Secondary | ICD-10-CM | POA: Insufficient documentation

## 2018-08-30 MED ORDER — SULFAMETHOXAZOLE-TRIMETHOPRIM 800-160 MG PO TABS
1.0000 | ORAL_TABLET | Freq: Once | ORAL | Status: AC
  Administered 2018-08-30: 1 via ORAL
  Filled 2018-08-30: qty 1

## 2018-08-30 MED ORDER — IBUPROFEN 400 MG PO TABS
600.0000 mg | ORAL_TABLET | Freq: Once | ORAL | Status: AC
  Administered 2018-08-30: 600 mg via ORAL
  Filled 2018-08-30: qty 1

## 2018-08-30 NOTE — Discharge Instructions (Signed)
Please pick up and begin taking your antibiotics tomorrow.  Ibuprofen and Tylenol as needed for pain.  Warm soaks can also help with pain and promote drainage.  Return for packing removal in 2 days if you have any signs of increasing redness, drainage, pain, fevers or any other new or concerning symptoms.

## 2018-08-30 NOTE — ED Provider Notes (Signed)
[name] EMERGENCY DEPARTMENT Provider Note   CSN: [id number] Arrival date & time: 08/30/18  1437     History   Chief Complaint Chief Complaint  Patient presents with  . Packing Removal    HPI Drew [name] is a 23 y.o. male.  Drew [name] is a 23 y.o. male history of asthma, who presents to the emergency department for evaluation of wound check.  Patient reports that he was seen here in the emergency department 2 days previously and had a pilonidal abscess drained.  The wound was packed and he was instructed to return in 2 days for packing removal.  Patient was prescribed Bactrim, but reports he has not been able to fill this prescription due to financial issues, but is planning to pick it up tomorrow after he gets paid.  He reports he is continued to have some drainage from the area as well as pain, but his wife has been changing dressing caring for the area and reports improving redness and decrease in the amount of drainage.  He has not had any fevers or chills.  Reports he still has some discomfort with walking and is concerned about going back to work tomorrow.     Past Medical History:  Diagnosis Date  . Abscess   . Asthma     There are no active problems to display for this patient.   Past Surgical History:  Procedure Laterality Date  . FRACTURE SURGERY          Home Medications    Prior to Admission medications   Medication Sig Start Date End Date Taking? Authorizing Provider  azithromycin (ZITHROMAX) 250 MG tablet Take 1 tablet (250 mg total) by mouth daily. Take first 2 tablets together, then 1 every day until finished. Patient not taking: Reported on 04/29/2018 02/13/18   Janne Napoleon, NP  benzonatate (TESSALON) 100 MG capsule Take 1 capsule (100 mg total) by mouth every 8 (eight) hours. Patient not taking: Reported on 04/29/2018 02/13/18   Janne Napoleon, NP  oxyCODONE-acetaminophen (PERCOCET/ROXICET) 5-325 MG tablet Take 1  tablet by mouth every 4 (four) hours as needed for severe pain. 04/29/18   Elpidio Anis, PA-C  sulfamethoxazole-trimethoprim (BACTRIM DS,SEPTRA DS) 800-160 MG tablet Take 1 tablet by mouth 2 (two) times daily for 7 days. 08/28/18 09/04/18  Robinson, [place] N, PA-C    Family History Family History  Adopted: Yes    Social History Social History   Tobacco Use  . Smoking status: Light Tobacco Smoker    Types: Cigars, Cigarettes  . Smokeless tobacco: Never Used  Substance Use Topics  . Alcohol use: Not Currently  . Drug use: Not Currently     Allergies   Dairy aid [lactase]; Silver; and Chocolate   Review of Systems Review of Systems  Constitutional: Negative for chills and fever.  Skin: Positive for color change and wound (Abscess).     Physical Exam Updated Vital Signs BP (!) 135/99 (BP Location: Right Arm)   Pulse (!) 104   Temp 98.6 F (37 C)   Resp 18   Ht 5\' 6"  (1.676 m)   Wt 72.6 kg   SpO2 100%   BMI 25.82 kg/m   Physical Exam Vitals signs and nursing note reviewed.  Constitutional:      General: He is not in acute distress.    Appearance: He is well-developed. He is not diaphoretic.  HENT:     Head: Normocephalic and atraumatic.  Eyes:  General:        Right eye: No discharge.        Left eye: No discharge.  Pulmonary:     Effort: Pulmonary effort is normal. No respiratory distress.  Genitourinary:    Comments: Previously drained pilonidal abscess noted with 2 cm incision with packing present, minimal surrounding erythema no palpable induration, packing removed with moderate amount of drainage absorbed in the packing, and small amount of expressible will drainage after removal Skin:    General: Skin is warm and dry.  Neurological:     Mental Status: He is alert and oriented to person, place, and time. Mental status is at baseline.     Coordination: Coordination normal.  Psychiatric:        Mood and Affect: Mood normal.        Behavior: Behavior  normal.      ED Treatments / Results  Labs (all labs ordered are listed, but only abnormal results are displayed) Labs Reviewed - No data to display  EKG None  Radiology No results found.  Procedures Wound packing Date/Time: 08/30/2018 4:28 PM Performed by: Dartha Lodge, PA-C Authorized by: Dartha Lodge, PA-C  Consent: Verbal consent obtained. Consent given by: patient Patient understanding: patient states understanding of the procedure being performed Patient identity confirmed: verbally with patient Preparation: Patient was prepped and draped in the usual sterile fashion. Local anesthesia used: no  Anesthesia: Local anesthesia used: no  Sedation: Patient sedated: no  Patient tolerance: Patient tolerated the procedure well with no immediate complications Comments: Packing from pilonidal cyst drained 2 days ago removed and there was still some expressible drainage present so wound was repacked with quarter inch iodoform gauze, tolerated well by patient    (including critical care time)  Medications Ordered in ED Medications  ibuprofen (ADVIL,MOTRIN) tablet 600 mg (has no administration in time range)  sulfamethoxazole-trimethoprim (BACTRIM DS,SEPTRA DS) 800-160 MG per tablet 1 tablet (has no administration in time range)     Initial Impression / Assessment and Plan / ED Course  I have reviewed the triage vital signs and the nursing notes.  Pertinent labs & imaging results that were available during my care of the patient were reviewed by me and considered in my medical decision making (see chart for details).  Pt with abscess seen two days ago with I&D. Chart reviewed in EPIC. Pt has been doing a very good job of taking care of the site at home.  Has not been able to pick up his antibiotics yet but was prescribed Bactrim and plans to pick it up tomorrow.  The site is healthy appearing, with improving redness, packing removed with moderate amount of absorbed  purulent drainage, there was still a small amount of purulent drainage expressible so wound was repacked.  No surrounding cellulitis and no palpable fluctuance or induration indicating deeper infection or residual abscess.  Encouraged warm soaks.  Patient given dose of antibiotics here and will pick up his prescription tomorrow.  Discussed home care and return precautions, pt advised to continue all antibiotics until they are gone.  Patient to return in 2 days for packing removal.    Final Clinical Impressions(s) / ED Diagnoses   Final diagnoses:  Abscess re-check    ED Discharge Orders    None       Legrand Rams 08/30/18 1632    Doug Sou, MD 08/30/18 1900

## 2018-08-30 NOTE — ED Triage Notes (Signed)
Pt arrives POV for packing removal from pilonidal cyst that was drained on Friday. Pt states that he has not been taking abx since Friday.

## 2018-09-12 ENCOUNTER — Encounter (HOSPITAL_COMMUNITY): Payer: Self-pay | Admitting: Emergency Medicine

## 2018-09-12 ENCOUNTER — Emergency Department (HOSPITAL_COMMUNITY)
Admission: EM | Admit: 2018-09-12 | Discharge: 2018-09-13 | Disposition: A | Discharge status: Home / Self Care | Payer: Self-pay | Attending: Emergency Medicine | Admitting: Emergency Medicine

## 2018-09-12 DIAGNOSIS — R197 Diarrhea, unspecified: Secondary | ICD-10-CM | POA: Insufficient documentation

## 2018-09-12 DIAGNOSIS — F17210 Nicotine dependence, cigarettes, uncomplicated: Secondary | ICD-10-CM | POA: Insufficient documentation

## 2018-09-12 DIAGNOSIS — R112 Nausea with vomiting, unspecified: Secondary | ICD-10-CM | POA: Insufficient documentation

## 2018-09-12 DIAGNOSIS — R55 Syncope and collapse: Secondary | ICD-10-CM | POA: Insufficient documentation

## 2018-09-12 DIAGNOSIS — J45909 Unspecified asthma, uncomplicated: Secondary | ICD-10-CM | POA: Insufficient documentation

## 2018-09-12 LAB — URINALYSIS, ROUTINE W REFLEX MICROSCOPIC
Bilirubin Urine: NEGATIVE
Glucose, UA: NEGATIVE mg/dL
Hgb urine dipstick: NEGATIVE
Ketones, ur: NEGATIVE mg/dL
Leukocytes, UA: NEGATIVE
Nitrite: NEGATIVE
Protein, ur: NEGATIVE mg/dL
SPECIFIC GRAVITY, URINE: 1.016 (ref 1.005–1.030)
pH: 6 (ref 5.0–8.0)

## 2018-09-12 LAB — CBC
HCT: 46.9 % (ref 39.0–52.0)
Hemoglobin: 16.1 g/dL (ref 13.0–17.0)
MCH: 28.2 pg (ref 26.0–34.0)
MCHC: 34.3 g/dL (ref 30.0–36.0)
MCV: 82.1 fL (ref 80.0–100.0)
Platelets: 237 10*3/uL (ref 150–400)
RBC: 5.71 MIL/uL (ref 4.22–5.81)
RDW: 13.9 % (ref 11.5–15.5)
WBC: 11.9 10*3/uL — ABNORMAL HIGH (ref 4.0–10.5)
nRBC: 0 % (ref 0.0–0.2)

## 2018-09-12 LAB — COMPREHENSIVE METABOLIC PANEL
ALT: 25 U/L (ref 0–44)
AST: 22 U/L (ref 15–41)
Albumin: 3.8 g/dL (ref 3.5–5.0)
Alkaline Phosphatase: 55 U/L (ref 38–126)
Anion gap: 8 (ref 5–15)
BUN: 7 mg/dL (ref 6–20)
CHLORIDE: 102 mmol/L (ref 98–111)
CO2: 25 mmol/L (ref 22–32)
Calcium: 8.8 mg/dL — ABNORMAL LOW (ref 8.9–10.3)
Creatinine, Ser: 1.05 mg/dL (ref 0.61–1.24)
GFR calc Af Amer: >60 mL/min (ref >60)
GFR calc non Af Amer: >60 mL/min (ref >60)
Glucose, Bld: 131 mg/dL — ABNORMAL HIGH (ref 70–99)
Potassium: 4 mmol/L (ref 3.5–5.1)
SODIUM: 135 mmol/L (ref 135–145)
Total Bilirubin: 0.4 mg/dL (ref 0.3–1.2)
Total Protein: 6.5 g/dL (ref 6.5–8.1)

## 2018-09-12 LAB — POC OCCULT BLOOD, ED: FECAL OCCULT BLD: NEGATIVE

## 2018-09-12 LAB — TYPE AND SCREEN
ABO/RH(D): AB POS
Antibody Screen: NEGATIVE

## 2018-09-12 LAB — LIPASE, BLOOD: Lipase: 40 U/L (ref 11–51)

## 2018-09-12 MED ORDER — ONDANSETRON HCL 4 MG/2ML IJ SOLN
4.0000 mg | Freq: Once | INTRAMUSCULAR | Status: AC
  Administered 2018-09-12: 4 mg via INTRAVENOUS
  Filled 2018-09-12: qty 2

## 2018-09-12 MED ORDER — DICYCLOMINE HCL 20 MG PO TABS: 20.0000 mg | ORAL_TABLET | Freq: Two times a day (BID) | ORAL | 0 refills | Status: DC

## 2018-09-12 MED ORDER — SODIUM CHLORIDE 0.9 % IV BOLUS
1000.0000 mL | Freq: Once | INTRAVENOUS | Status: AC
  Administered 2018-09-12: 1000 mL via INTRAVENOUS

## 2018-09-12 MED ORDER — ONDANSETRON HCL 4 MG PO TABS: 4.0000 mg | ORAL_TABLET | Freq: Three times a day (TID) | ORAL | 0 refills | Status: DC | PRN

## 2018-09-12 MED ORDER — SODIUM CHLORIDE 0.9% FLUSH: 3.0000 mL | Freq: Once | INTRAVENOUS | Status: DC

## 2018-09-12 NOTE — ED Provider Notes (Signed)
[facility]   CSN: [id number] Arrival date & time: 09/12/18  2126     History   Chief Complaint Chief Complaint  Patient presents with  . Abdominal Pain  . Hematemesis    HPI Drew [name] is a 23 y.o. male.  The history is provided by the patient and the spouse. No language interpreter was used.  Abdominal Pain  Pain location:  Periumbilical Pain quality: cramping   Pain radiates to:  Does not radiate Pain severity:  Mild Onset quality:  Gradual Duration:  8 hours Timing:  Constant Progression:  Improving Chronicity:  New Context: not alcohol use, not awakening from sleep, not diet changes, not eating, not laxative use, not medication withdrawal, not previous surgeries, not recent illness, not recent sexual activity, not recent travel, not retching, not sick contacts and not suspicious food intake   Relieved by:  None tried Worsened by:  Nothing Ineffective treatments:  None tried Associated symptoms: hematemesis, melena and vomiting   Associated symptoms: no chest pain, no chills, no constipation, no cough, no diarrhea, no dysuria, no fatigue, no fever, no flatus, no hematochezia, no hematuria, no nausea, no shortness of breath and no sore throat   Associated symptoms comment:  2 episodes of vomiting with small streaks of blood on the 2nd episode 2 episodes of watery diarrhea that he describes as black Denies hx of PUD or chronic abd pain Risk factors: no alcohol abuse, no aspirin use, not elderly, has not had multiple surgeries, no NSAID use, not obese and no recent hospitalization   Loss of Consciousness  Episode history:  Single Most recent episode:  Today Chronicity:  New Context comment:  Vomiting Witnessed: yes   Associated symptoms: rectal bleeding, vomiting and weakness   Associated symptoms: no chest pain, no confusion, no difficulty breathing, no dizziness, no fever, no nausea, no palpitations, no  recent fall, no recent injury, no seizures, no shortness of breath and no visual change    The patient presents with cc of the abdominal pain.  He had 2 episodes of vomiting.  The first was normal vomitus, the second was vomitus that had light streaky blood.  Patient also complains of several episodes of diarrhea.  He has that he noticed that his stool is black.  He denies a history of peptic ulcer disease or chronic abdominal pain.  Apparently when the patient had an episode of vomiting he passed out.  He hit his head on the wall the bathroom.  He is complaining of a slight headache.  He is never had a episode of syncope in the past.  Was witnessed by his wife.  He was in a seated position on hands and knees on the floor over the toilet when this occurred.  He felt dizziness prodrome.  Past Medical History:  Diagnosis Date  . Abscess   . Asthma     There are no active problems to display for this patient.   Past Surgical History:  Procedure Laterality Date  . FRACTURE SURGERY          Home Medications    Prior to Admission medications   Medication Sig Start Date End Date Taking? Authorizing Provider  azithromycin (ZITHROMAX) 250 MG tablet Take 1 tablet (250 mg total) by mouth daily. Take first 2 tablets together, then 1 every day until finished. Patient not taking: Reported on 04/29/2018 02/13/18   Janne NapoleonNeese, Hope M, NP  benzonatate (TESSALON) 100 MG capsule Take 1 capsule (  100 mg total) by mouth every 8 (eight) hours. Patient not taking: Reported on 04/29/2018 02/13/18   Janne NapoleonNeese, Hope M, NP  oxyCODONE-acetaminophen (PERCOCET/ROXICET) 5-325 MG tablet Take 1 tablet by mouth every 4 (four) hours as needed for severe pain. 04/29/18   Elpidio AnisUpstill, Shari, PA-C    Family History Family History  Adopted: Yes    Social History Social History   Tobacco Use  . Smoking status: Light Tobacco Smoker    Types: Cigars, Cigarettes  . Smokeless tobacco: Never Used  Substance Use Topics  . Alcohol use: Not  Currently  . Drug use: Not Currently     Allergies   Dairy aid [lactase]; Silver; and Chocolate   Review of Systems Review of Systems  Constitutional: Negative for chills, fatigue and fever.  HENT: Negative for sore throat.   Respiratory: Negative for cough and shortness of breath.   Cardiovascular: Positive for syncope. Negative for chest pain and palpitations.  Gastrointestinal: Positive for abdominal pain, hematemesis, melena and vomiting. Negative for constipation, diarrhea, flatus, hematochezia and nausea.  Genitourinary: Negative for dysuria and hematuria.  Musculoskeletal: Negative for myalgias.  Neurological: Positive for syncope and weakness. Negative for dizziness and seizures.  Psychiatric/Behavioral: Negative for confusion.  All other systems reviewed and are negative.    Physical Exam Updated Vital Signs BP 121/74   Pulse 62   Resp 18   Ht 5' 6.5" (1.689 m)   Wt 70.3 kg   SpO2 98%   BMI 24.64 kg/m   Physical Exam Vitals signs and nursing note reviewed.  Constitutional:      General: He is not in acute distress.    Appearance: He is well-developed. He is not diaphoretic.  HENT:     Head: Normocephalic and atraumatic.  Eyes:     General: No scleral icterus.    Conjunctiva/sclera: Conjunctivae normal.  Neck:     Musculoskeletal: Normal range of motion and neck supple.  Cardiovascular:     Rate and Rhythm: Normal rate and regular rhythm.     Heart sounds: Normal heart sounds.  Pulmonary:     Effort: Pulmonary effort is normal. No respiratory distress.     Breath sounds: Normal breath sounds.  Abdominal:     Palpations: Abdomen is soft.     Tenderness: There is no abdominal tenderness. There is no guarding.  Genitourinary:    Comments: Digital Rectal Exam reveals sphincter with good tone. No external hemorrhoids. No masses or fissures. Stool color is brown with no overt blood.  Skin:    General: Skin is warm and dry.  Neurological:     Mental  Status: He is alert.     Comments: Speech is clear and goal oriented, follows commands Major Cranial nerves without deficit, no facial droop Normal strength in upper and lower extremities bilaterally including dorsiflexion and plantar flexion, strong and equal grip strength Sensation normal to light and sharp touch Moves extremities without ataxia, coordination intact Normal finger to nose and rapid alternating movements Neg romberg, no pronator drift Normal gait Normal heel-shin and balance   Psychiatric:        Behavior: Behavior normal.      ED Treatments / Results  Labs (all labs ordered are listed, but only abnormal results are displayed) Labs Reviewed  CBC - Abnormal; Notable for the following components:      Result Value   WBC 11.9 (*)    All other components within normal limits  LIPASE, BLOOD  COMPREHENSIVE METABOLIC PANEL  URINALYSIS,  ROUTINE W REFLEX MICROSCOPIC  POC OCCULT BLOOD, ED  TYPE AND SCREEN    EKG None  Radiology No results found.  Procedures Procedures (including critical care time)  Medications Ordered in ED Medications  sodium chloride flush (NS) 0.9 % injection 3 mL (3 mLs Intravenous Not Given 09/12/18 2140)  sodium chloride 0.9 % bolus 1,000 mL (1,000 mLs Intravenous New Bag/Given 09/12/18 2140)     Initial Impression / Assessment and Plan / ED Course  I have reviewed the triage vital signs and the nursing notes.  Pertinent labs & imaging results that were available during my care of the patient were reviewed by me and considered in my medical decision making (see chart for details).     24 year old male who presents with nausea vomiting and diarrhea, single syncopal episode.  He has no nausea or vomiting here in the emergency department, no diarrhea, normal rectal exam without evidence of melena.  Hemoglobin is normal.  Negative orthostatic vital signs.  Believe the patient likely had a vasovagal syncope episode during his second  episode of vomiting or could have possibly been did due to the site of blood.  He has stable throughout his ER visit.  Reviewed the patient's EKG which shows no abnormal arrhythmias, WPW, Brugada.  Patient be discharged with Bentyl and Zofran.  Discussed outpatient follow-up and return precautions.  Appears appropriate for discharge at this time  Final Clinical Impressions(s) / ED Diagnoses   Final diagnoses:  Nausea vomiting and diarrhea  Syncope, vasovagal    ED Discharge Orders    None       Arthor Captain, PA-C 09/13/18 0012    Rolan Bucco, MD 09/13/18 [id number]

## 2018-09-12 NOTE — ED Triage Notes (Signed)
Reports waking up with abdominal pain and diarrhea with black tarry stool then reports vomit having streaks of blood.  Reports abdominal cramping in center of belly.  Also reports passing out and hitting his head.  Per spouse found on floor in the bathroom.

## 2018-09-12 NOTE — Discharge Instructions (Addendum)
Your entire work-up was normal.  There is no blood in your stool.  Believe your episode of passing out was due to what is called vasovagal syncope.  This occurs sometimes during episodes of straining or vomiting due to pressure on the vagus nerve.  I am discharging you with medication for stomach cramping and vomiting.  You may take Imodium, Kaopectate, or Pepto-Bismol to slow your diarrhea.  Make sure to stay well-hydrated.  Return if you have a seizure, have high fever have intractable vomiting or have repeat episodes of passing out

## 2018-09-13 LAB — ABO/RH: ABO/RH(D): AB POS

## 2018-09-13 NOTE — ED Notes (Signed)
Pt discharged from ED; instructions provided and scripts given; Pt encouraged to return to ED if symptoms worsen and to f/u with PCP; Pt verbalized understanding of all instructions 

## 2018-10-28 ENCOUNTER — Other Ambulatory Visit: Payer: Self-pay

## 2018-10-28 ENCOUNTER — Emergency Department (HOSPITAL_COMMUNITY)
Admission: EM | Admit: 2018-10-28 | Discharge: 2018-10-29 | Discharge status: Against Medical Advice | Payer: Self-pay | Attending: Emergency Medicine | Admitting: Emergency Medicine

## 2018-10-28 ENCOUNTER — Encounter (HOSPITAL_COMMUNITY): Payer: Self-pay | Admitting: Emergency Medicine

## 2018-10-28 DIAGNOSIS — Z5321 Procedure and treatment not carried out due to patient leaving prior to being seen by health care provider: Secondary | ICD-10-CM | POA: Insufficient documentation

## 2018-10-28 NOTE — ED Triage Notes (Signed)
Pt c/o headache and right sided facial pain that started this morning. Denies nausea/sensitivity to light and sound. No known hx of migraines.

## 2018-10-29 MED ORDER — IBUPROFEN 400 MG PO TABS
400.0000 mg | ORAL_TABLET | Freq: Once | ORAL | Status: AC | PRN
  Administered 2018-10-29: 400 mg via ORAL
  Filled 2018-10-29: qty 1

## 2018-10-29 NOTE — ED Notes (Signed)
Pt c/o increased pain.

## 2018-10-31 ENCOUNTER — Emergency Department (HOSPITAL_COMMUNITY)
Admission: EM | Admit: 2018-10-31 | Discharge: 2018-11-01 | Disposition: A | Discharge status: Home / Self Care | Payer: Self-pay | Attending: Emergency Medicine | Admitting: Emergency Medicine

## 2018-10-31 ENCOUNTER — Other Ambulatory Visit: Payer: Self-pay

## 2018-10-31 ENCOUNTER — Emergency Department (HOSPITAL_COMMUNITY): Payer: Self-pay

## 2018-10-31 ENCOUNTER — Encounter (HOSPITAL_COMMUNITY): Payer: Self-pay | Admitting: *Deleted

## 2018-10-31 DIAGNOSIS — R55 Syncope and collapse: Secondary | ICD-10-CM

## 2018-10-31 DIAGNOSIS — R42 Dizziness and giddiness: Secondary | ICD-10-CM | POA: Insufficient documentation

## 2018-10-31 DIAGNOSIS — Z720 Tobacco use: Secondary | ICD-10-CM | POA: Insufficient documentation

## 2018-10-31 DIAGNOSIS — J45909 Unspecified asthma, uncomplicated: Secondary | ICD-10-CM | POA: Insufficient documentation

## 2018-10-31 DIAGNOSIS — R69 Illness, unspecified: Secondary | ICD-10-CM

## 2018-10-31 DIAGNOSIS — J111 Influenza due to unidentified influenza virus with other respiratory manifestations: Secondary | ICD-10-CM | POA: Insufficient documentation

## 2018-10-31 LAB — BASIC METABOLIC PANEL
Anion gap: 7 (ref 5–15)
BUN: 9 mg/dL (ref 6–20)
CALCIUM: 9.2 mg/dL (ref 8.9–10.3)
CO2: 27 mmol/L (ref 22–32)
Chloride: 106 mmol/L (ref 98–111)
Creatinine, Ser: 0.85 mg/dL (ref 0.61–1.24)
GFR calc Af Amer: >60 mL/min (ref >60)
GFR calc non Af Amer: >60 mL/min (ref >60)
Glucose, Bld: 90 mg/dL (ref 70–99)
POTASSIUM: 4 mmol/L (ref 3.5–5.1)
Sodium: 140 mmol/L (ref 135–145)

## 2018-10-31 LAB — RAPID URINE DRUG SCREEN, HOSP PERFORMED
Amphetamines: NOT DETECTED
Barbiturates: NOT DETECTED
Benzodiazepines: NOT DETECTED
Cocaine: NOT DETECTED
Opiates: NOT DETECTED
Tetrahydrocannabinol: POSITIVE — AB

## 2018-10-31 LAB — URINALYSIS, ROUTINE W REFLEX MICROSCOPIC
Bilirubin Urine: NEGATIVE
Glucose, UA: NEGATIVE mg/dL
Hgb urine dipstick: NEGATIVE
KETONES UR: NEGATIVE mg/dL
Leukocytes,Ua: NEGATIVE
Nitrite: NEGATIVE
Protein, ur: NEGATIVE mg/dL
Specific Gravity, Urine: 1.011 (ref 1.005–1.030)
pH: 7 (ref 5.0–8.0)

## 2018-10-31 LAB — CBC WITH DIFFERENTIAL/PLATELET
Abs Immature Granulocytes: 0.08 10*3/uL — ABNORMAL HIGH (ref 0.00–0.07)
Basophils Absolute: 0.1 10*3/uL (ref 0.0–0.1)
Basophils Relative: 1 %
EOS ABS: 0.2 10*3/uL (ref 0.0–0.5)
Eosinophils Relative: 1 %
HCT: 48.2 % (ref 39.0–52.0)
Hemoglobin: 16.4 g/dL (ref 13.0–17.0)
Immature Granulocytes: 1 %
Lymphocytes Relative: 22 %
Lymphs Abs: 2.7 10*3/uL (ref 0.7–4.0)
MCH: 28 pg (ref 26.0–34.0)
MCHC: 34 g/dL (ref 30.0–36.0)
MCV: 82.4 fL (ref 80.0–100.0)
Monocytes Absolute: 0.7 10*3/uL (ref 0.1–1.0)
Monocytes Relative: 5 %
Neutro Abs: 8.8 10*3/uL — ABNORMAL HIGH (ref 1.7–7.7)
Neutrophils Relative %: 70 %
Platelets: 372 10*3/uL (ref 150–400)
RBC: 5.85 MIL/uL — ABNORMAL HIGH (ref 4.22–5.81)
RDW: 13.8 % (ref 11.5–15.5)
WBC: 12.5 10*3/uL — ABNORMAL HIGH (ref 4.0–10.5)
nRBC: 0 % (ref 0.0–0.2)

## 2018-10-31 MED ORDER — DIPHENHYDRAMINE HCL 50 MG/ML IJ SOLN
25.0000 mg | Freq: Once | INTRAMUSCULAR | Status: AC
  Administered 2018-10-31: 25 mg via INTRAVENOUS
  Filled 2018-10-31: qty 1

## 2018-10-31 MED ORDER — PROCHLORPERAZINE EDISYLATE 10 MG/2ML IJ SOLN
10.0000 mg | Freq: Once | INTRAMUSCULAR | Status: AC
  Administered 2018-10-31: 10 mg via INTRAVENOUS
  Filled 2018-10-31: qty 2

## 2018-10-31 MED ORDER — SODIUM CHLORIDE 0.9 % IV BOLUS
1000.0000 mL | Freq: Once | INTRAVENOUS | Status: AC
  Administered 2018-10-31: 1000 mL via INTRAVENOUS

## 2018-10-31 MED ORDER — ONDANSETRON 4 MG PO TBDP: ORAL_TABLET | ORAL | 0 refills | Status: DC

## 2018-10-31 MED ORDER — KETOROLAC TROMETHAMINE 30 MG/ML IJ SOLN
30.0000 mg | Freq: Once | INTRAMUSCULAR | Status: AC
  Administered 2018-10-31: 30 mg via INTRAVENOUS
  Filled 2018-10-31: qty 1

## 2018-10-31 NOTE — ED Triage Notes (Signed)
Pt seen @ [facility] for h/a.  Per [facility], c/o h/a & flu-like symptoms, productive cough.

## 2018-10-31 NOTE — ED Notes (Signed)
Pt. Ambulated in hallway on RA. O2 saturations measured 97-98%.

## 2018-10-31 NOTE — ED Provider Notes (Signed)
[facility]-EMERGENCY DEPT Provider Note   CSN: [id number] Arrival date & time: 10/31/18  1423    History   Chief Complaint Chief Complaint  Patient presents with  . flu like symptoms    HPI Drew [name] is a 23 y.o. male.     Drew [name] is a 23 y.o. male with a history of asthma, who presents to the emergency department for evaluation of headache, cough, congestion, chills, body aches and lightheadedness.  Patient reports he has been getting increasing headaches over the past week, was seen at [facility] ED 1 week ago and they gave him shots for his headache which improved it but it is come back over the past few days.  He denies any associated vision changes, facial asymmetry, numbness weakness or tingling.  No associated nausea or vomiting.  No dizziness.  Headaches are gradual in onset, and this is not the worst headache of his life.  He has not had any associated fevers, does report some chills but is also had a persistent productive cough over the past week as well as some nasal congestion, rhinorrhea and mild sore throat.  He reports he has not had much of an appetite and has not had much to eat or drink over the past few days.  And reports that he today he is felt frequently like he is going to pass out.  He reports that earlier he was sitting down and then seemed to lose consciousness briefly.  His girlfriend reports she was with him throughout the day and he did not exhibit any seizure-like activity.  Patient does report marijuana use early this morning, typically uses marijuana daily, denies other substance use.  He has not done anything to treat his symptoms prior to arrival, unsure of any sick contacts, no other aggravating or alleviating factors.     Past Medical History:  Diagnosis Date  . Abscess   . Asthma     There are no active problems to display for this patient.   Past Surgical History:  Procedure Laterality Date  .  FRACTURE SURGERY          Home Medications    Prior to Admission medications   Medication Sig Start Date End Date Taking? Authorizing Provider  azithromycin (ZITHROMAX) 250 MG tablet Take 1 tablet (250 mg total) by mouth daily. Take first 2 tablets together, then 1 every day until finished. Patient not taking: Reported on 04/29/2018 02/13/18   Janne Napoleon, NP  benzonatate (TESSALON) 100 MG capsule Take 1 capsule (100 mg total) by mouth every 8 (eight) hours. Patient not taking: Reported on 04/29/2018 02/13/18   Janne Napoleon, NP  dicyclomine (BENTYL) 20 MG tablet Take 1 tablet (20 mg total) by mouth 2 (two) times daily. Patient not taking: Reported on 10/31/2018 09/12/18   Arthor Captain, PA-C  ondansetron [facility] ODT) 4 MG disintegrating tablet 4mg  ODT q4 hours prn nausea/vomit 10/31/18   Dartha Lodge, PA-C  ondansetron (ZOFRAN) 4 MG tablet Take 1 tablet (4 mg total) by mouth every 8 (eight) hours as needed for nausea or vomiting. Patient not taking: Reported on 10/31/2018 09/12/18   Arthor Captain, PA-C  oxyCODONE-acetaminophen (PERCOCET/ROXICET) 5-325 MG tablet Take 1 tablet by mouth every 4 (four) hours as needed for severe pain. Patient not taking: Reported on 09/12/2018 04/29/18   Elpidio Anis, PA-C    Family History Family History  Adopted: Yes    Social History Social History   Tobacco Use  .  Smoking status: Light Tobacco Smoker    Types: Cigars, Cigarettes  . Smokeless tobacco: Never Used  Substance Use Topics  . Alcohol use: Not Currently  . Drug use: Not Currently     Allergies   Dairy aid [lactase]; Silver; and Chocolate   Review of Systems Review of Systems  Constitutional: Positive for chills. Negative for fever.  HENT: Positive for congestion, rhinorrhea and sore throat.   Respiratory: Positive for cough. Negative for shortness of breath.   Cardiovascular: Negative for chest pain.  Gastrointestinal: Negative for abdominal pain, diarrhea, nausea and vomiting.    Genitourinary: Negative for dysuria and frequency.  Musculoskeletal: Negative for arthralgias, myalgias, neck pain and neck stiffness.  Skin: Negative for color change and rash.  Neurological: Positive for syncope, light-headedness and headaches. Negative for dizziness, speech difficulty, weakness and numbness.     Physical Exam Updated Vital Signs BP 129/85 (BP Location: Left Arm)   Pulse 74   Temp 98 F (36.7 C) (Oral)   Resp 18   Ht 5' 6.5" (1.689 m)   Wt 71.7 kg   SpO2 99%   BMI 25.12 kg/m   Physical Exam Vitals signs and nursing note reviewed.  Constitutional:      General: He is not in acute distress.    Appearance: Normal appearance. He is well-developed. He is not ill-appearing or diaphoretic.  HENT:     Head: Normocephalic and atraumatic.     Right Ear: Tympanic membrane and ear canal normal.     Left Ear: Tympanic membrane and ear canal normal.     Nose: Congestion and rhinorrhea present.     Comments: Bilateral nares patent with moderate mucosal edema and clear rhinorrhea present.     Mouth/Throat:     Mouth: Mucous membranes are moist.     Pharynx: Oropharynx is clear.     Comments: Posterior oropharynx clear and mucous membranes moist, there is mild erythema but no edema or tonsillar exudates, uvula midline, normal phonation, no trismus, tolerating secretions without difficulty. Eyes:     General:        Right eye: No discharge.        Left eye: No discharge.     Pupils: Pupils are equal, round, and reactive to light.  Neck:     Musculoskeletal: Neck supple.  Cardiovascular:     Rate and Rhythm: Normal rate and regular rhythm.     Pulses: Normal pulses.     Heart sounds: Normal heart sounds. No murmur. No friction rub. No gallop.   Pulmonary:     Effort: Pulmonary effort is normal. No respiratory distress.     Breath sounds: Normal breath sounds. No wheezing or rales.     Comments: Respirations equal and unlabored, patient able to speak in full  sentences, lungs clear to auscultation bilaterally Abdominal:     General: Abdomen is flat. Bowel sounds are normal. There is no distension.     Palpations: Abdomen is soft. There is no mass.     Tenderness: There is no abdominal tenderness. There is no guarding.     Comments: Abdomen soft, nondistended, nontender to palpation in all quadrants without guarding or peritoneal signs  Musculoskeletal:        General: No deformity.     Right lower leg: No edema.     Left lower leg: No edema.  Skin:    General: Skin is warm and dry.     Capillary Refill: Capillary refill takes less than 2 seconds.  Neurological:     Mental Status: He is alert and oriented to person, place, and time.     Coordination: Coordination normal.     Comments: Speech is clear, able to follow commands CN III-XII intact Normal strength in upper and lower extremities bilaterally including dorsiflexion and plantar flexion, strong and equal grip strength Sensation normal to light and sharp touch Moves extremities without ataxia, coordination intact   Psychiatric:        Mood and Affect: Mood normal.        Behavior: Behavior normal.      ED Treatments / Results  Labs (all labs ordered are listed, but only abnormal results are displayed) Labs Reviewed  CBC WITH DIFFERENTIAL/PLATELET - Abnormal; Notable for the following components:      Result Value   WBC 12.5 (*)    RBC 5.85 (*)    Neutro Abs 8.8 (*)    Abs Immature Granulocytes 0.08 (*)    All other components within normal limits  RAPID URINE DRUG SCREEN, HOSP PERFORMED - Abnormal; Notable for the following components:   Tetrahydrocannabinol POSITIVE (*)    All other components within normal limits  BASIC METABOLIC PANEL  URINALYSIS, ROUTINE W REFLEX MICROSCOPIC    EKG EKG Interpretation  Date/Time:  Saturday October 31 2018 21:31:25 EST Ventricular Rate:  63 PR Interval:    QRS Duration: 83 QT Interval:  436 QTC Calculation: 447 R  Axis:   110 Text Interpretation:  Sinus rhythm Borderline right axis deviation no sig change from previous Confirmed by Arby Barrette [phone]) on 10/31/2018 10:44:04 PM   Radiology Dg Chest 2 View  Result Date: 10/31/2018 CLINICAL DATA:  Cough EXAM: CHEST - 2 VIEW COMPARISON:  None. FINDINGS: The heart size and mediastinal contours are within normal limits. Both lungs are clear. The visualized skeletal structures are unremarkable. IMPRESSION: No active cardiopulmonary disease. Electronically Signed   By: Tollie Eth M.D.   On: 10/31/2018 20:30    Procedures Procedures (including critical care time)  Medications Ordered in ED Medications  sodium chloride 0.9 % bolus 1,000 mL (0 mLs Intravenous Stopped 10/31/18 2358)  ketorolac (TORADOL) 30 MG/ML injection 30 mg (30 mg Intravenous Given 10/31/18 2049)  prochlorperazine (COMPAZINE) injection 10 mg (10 mg Intravenous Given 10/31/18 2049)  diphenhydrAMINE (BENADRYL) injection 25 mg (25 mg Intravenous Given 10/31/18 2049)     Initial Impression / Assessment and Plan / ED Course  I have reviewed the triage vital signs and the nursing notes.  Pertinent labs & imaging results that were available during my care of the patient were reviewed by me and considered in my medical decision making (see chart for details).  Patient presents for evaluation of headache, lightheadedness and flulike symptoms.  He is overall well-appearing with normal vitals.  Has had near syncopal symptoms today I suspect this is likely due to dehydration as patient has not had much to eat or drink over the past few days.  Has had productive cough over the past week but has not had associated shortness of breath or chest pain.  Reports headache, no neurologic deficits on exam, no associated fever, no nuchal rigidity to suggest meningitis and I have low suspicion for [facility] or [facility] I suspect headache is related to viral syndrome.  Given near syncopal symptoms will check basic labs, EKG and  urinalysis will give IV fluid bolus and headache cocktail and reevaluate.  EKG shows sinus rhythm with no concerning ischemic changes or arrhythmia.  Labs overall reassuring,  patient does have mild leukocytosis of 12.5 likely in the setting of viral infection, normal hemoglobin, no acute electrolyte derangements and normal renal function, no evidence of urinary tract infection or significant dehydration.  UDS positive for THC.  Chest x-ray shows no evidence of pneumonia or other active cardiopulmonary disease.  On reevaluation headache is completely resolved and patient reports he is feeling much better.  He was able to ambulate throughout the department without any near syncopal symptoms.  Tolerating p.o. food and fluids here in the emergency department.  I suspect a viral syndrome as the cause for the majority of patient's symptoms and he was likely somewhat dehydrated.  I have discussed appropriate symptomatic treatment, encourage PCP follow-up.  Return precautions discussed.  Patient expresses understanding and agreement with plan.  Discharged home in good condition.  Final Clinical Impressions(s) / ED Diagnoses   Final diagnoses:  Influenza-like illness  Near syncope    ED Discharge Orders         Ordered    ondansetron (ZOFRAN ODT) 4 MG disintegrating tablet     10/31/18 2342           Jodi Geralds [place], [place] 11/05/18 1749    Arby Barrette, MD 11/07/18 1315

## 2018-10-31 NOTE — Discharge Instructions (Signed)
You have the flu this is a viral infection that will likely start to improve after 5-7 days, antibiotics are not helpful in treating viral infections.  You may use Zofran as needed for nausea. Please make sure you are drinking plenty of fluids. You can treat your symptoms supportively with tylenol/ibuprofen for fevers and pains, Zyrtec and Flonase to heal with nasal congestion, and over the counter cough syrups and throat lozenges to help with cough. If your symptoms are not improving please follow up with you Primary doctor.   If you develop persistent fevers, shortness of breath or difficulty breathing, chest pain, severe headache and neck pain, persistent nausea and vomiting or other new or concerning symptoms return to the Emergency department.

## 2018-10-31 NOTE — ED Notes (Signed)
Patient transported to X-ray 

## 2018-10-31 NOTE — ED Notes (Signed)
Pt stated "I was also seen @ [facility] last week.  They gave me 3 shots for my h/a.  I've been coughing x 1 week.  [facility] told me I didn't have the flu.  Today, I feel like I'm going in and out."  Pt presents in no distress.

## 2018-11-19 ENCOUNTER — Emergency Department (HOSPITAL_COMMUNITY)
Admission: EM | Admit: 2018-11-19 | Discharge: 2018-11-19 | Disposition: A | Discharge status: Home / Self Care | Payer: Self-pay | Attending: Emergency Medicine | Admitting: Emergency Medicine

## 2018-11-19 ENCOUNTER — Encounter (HOSPITAL_COMMUNITY): Payer: Self-pay

## 2018-11-19 ENCOUNTER — Other Ambulatory Visit: Payer: Self-pay

## 2018-11-19 DIAGNOSIS — Z5321 Procedure and treatment not carried out due to patient leaving prior to being seen by health care provider: Secondary | ICD-10-CM | POA: Insufficient documentation

## 2018-11-19 DIAGNOSIS — R109 Unspecified abdominal pain: Secondary | ICD-10-CM | POA: Insufficient documentation

## 2018-11-19 MED ORDER — SODIUM CHLORIDE 0.9% FLUSH: 3.0000 mL | Freq: Once | INTRAVENOUS | Status: DC

## 2018-11-19 NOTE — ED Notes (Signed)
Patient refused to put a gown on. 

## 2018-11-19 NOTE — ED Triage Notes (Signed)
Patient c/o abdominal pain and vomiting since yesterday. Patient denies any diarrhea or fever.

## 2018-11-19 NOTE — ED Notes (Signed)
Pt left, pt couldn't have his girlfriend with him so he left the property.

## 2018-11-21 ENCOUNTER — Emergency Department (HOSPITAL_COMMUNITY)
Admission: EM | Admit: 2018-11-21 | Discharge: 2018-11-21 | Disposition: A | Discharge status: Home / Self Care | Payer: Self-pay | Attending: Emergency Medicine | Admitting: Emergency Medicine

## 2018-11-21 ENCOUNTER — Other Ambulatory Visit: Payer: Self-pay

## 2018-11-21 ENCOUNTER — Encounter (HOSPITAL_COMMUNITY): Payer: Self-pay | Admitting: *Deleted

## 2018-11-21 ENCOUNTER — Emergency Department (HOSPITAL_COMMUNITY): Payer: Self-pay

## 2018-11-21 DIAGNOSIS — R1031 Right lower quadrant pain: Secondary | ICD-10-CM | POA: Insufficient documentation

## 2018-11-21 DIAGNOSIS — Z79899 Other long term (current) drug therapy: Secondary | ICD-10-CM | POA: Insufficient documentation

## 2018-11-21 DIAGNOSIS — R109 Unspecified abdominal pain: Secondary | ICD-10-CM

## 2018-11-21 DIAGNOSIS — J45909 Unspecified asthma, uncomplicated: Secondary | ICD-10-CM | POA: Insufficient documentation

## 2018-11-21 DIAGNOSIS — F17210 Nicotine dependence, cigarettes, uncomplicated: Secondary | ICD-10-CM | POA: Insufficient documentation

## 2018-11-21 DIAGNOSIS — L0591 Pilonidal cyst without abscess: Secondary | ICD-10-CM | POA: Insufficient documentation

## 2018-11-21 LAB — CBC WITH DIFFERENTIAL/PLATELET
Abs Immature Granulocytes: 0.04 10*3/uL (ref 0.00–0.07)
Basophils Absolute: 0.1 10*3/uL (ref 0.0–0.1)
Basophils Relative: 1 %
Eosinophils Absolute: 0.1 10*3/uL (ref 0.0–0.5)
Eosinophils Relative: 2 %
HCT: 46.9 % (ref 39.0–52.0)
Hemoglobin: 15.9 g/dL (ref 13.0–17.0)
Immature Granulocytes: 1 %
Lymphocytes Relative: 24 %
Lymphs Abs: 1.9 10*3/uL (ref 0.7–4.0)
MCH: 28.2 pg (ref 26.0–34.0)
MCHC: 33.9 g/dL (ref 30.0–36.0)
MCV: 83.3 fL (ref 80.0–100.0)
Monocytes Absolute: 0.4 10*3/uL (ref 0.1–1.0)
Monocytes Relative: 5 %
Neutro Abs: 5.5 10*3/uL (ref 1.7–7.7)
Neutrophils Relative %: 67 %
Platelets: 242 10*3/uL (ref 150–400)
RBC: 5.63 MIL/uL (ref 4.22–5.81)
RDW: 14.4 % (ref 11.5–15.5)
WBC: 8.1 10*3/uL (ref 4.0–10.5)
nRBC: 0 % (ref 0.0–0.2)

## 2018-11-21 LAB — COMPREHENSIVE METABOLIC PANEL
ALT: 12 U/L (ref 0–44)
AST: 14 U/L — ABNORMAL LOW (ref 15–41)
Albumin: 4 g/dL (ref 3.5–5.0)
Alkaline Phosphatase: 59 U/L (ref 38–126)
Anion gap: 6 (ref 5–15)
BUN: 8 mg/dL (ref 6–20)
CO2: 28 mmol/L (ref 22–32)
Calcium: 9.2 mg/dL (ref 8.9–10.3)
Chloride: 106 mmol/L (ref 98–111)
Creatinine, Ser: 0.98 mg/dL (ref 0.61–1.24)
GFR calc Af Amer: >60 mL/min (ref >60)
GFR calc non Af Amer: >60 mL/min (ref >60)
Glucose, Bld: 102 mg/dL — ABNORMAL HIGH (ref 70–99)
Potassium: 4.3 mmol/L (ref 3.5–5.1)
Sodium: 140 mmol/L (ref 135–145)
Total Bilirubin: 0.5 mg/dL (ref 0.3–1.2)
Total Protein: 6.9 g/dL (ref 6.5–8.1)

## 2018-11-21 LAB — LIPASE, BLOOD: Lipase: 36 U/L (ref 11–51)

## 2018-11-21 MED ORDER — DOXYCYCLINE HYCLATE 100 MG PO CAPS: 100.0000 mg | ORAL_CAPSULE | Freq: Two times a day (BID) | ORAL | 0 refills | Status: DC

## 2018-11-21 MED ORDER — HYDROCODONE-ACETAMINOPHEN 5-325 MG PO TABS: 1.0000 | ORAL_TABLET | ORAL | 0 refills | Status: DC | PRN

## 2018-11-21 MED ORDER — SODIUM CHLORIDE 0.9 % IV SOLN
INTRAVENOUS | Status: DC
  Administered 2018-11-21: 18:00:00 via INTRAVENOUS

## 2018-11-21 MED ORDER — MORPHINE SULFATE (PF) 4 MG/ML IV SOLN
6.0000 mg | Freq: Once | INTRAVENOUS | Status: AC
  Administered 2018-11-21: 6 mg via INTRAVENOUS
  Filled 2018-11-21: qty 2

## 2018-11-21 MED ORDER — SODIUM CHLORIDE 0.9 % IV BOLUS
1000.0000 mL | Freq: Once | INTRAVENOUS | Status: AC
  Administered 2018-11-21: 1000 mL via INTRAVENOUS

## 2018-11-21 MED ORDER — ONDANSETRON HCL 4 MG/2ML IJ SOLN
4.0000 mg | Freq: Once | INTRAMUSCULAR | Status: AC
  Administered 2018-11-21: 4 mg via INTRAVENOUS
  Filled 2018-11-21: qty 2

## 2018-11-21 MED ORDER — LIDOCAINE-EPINEPHRINE 2 %-1:100000 IJ SOLN
20.0000 mL | Freq: Once | INTRAMUSCULAR | Status: AC
  Administered 2018-11-21: 20 mL via INTRADERMAL
  Filled 2018-11-21: qty 20

## 2018-11-21 MED ORDER — IOHEXOL 300 MG/ML  SOLN
100.0000 mL | Freq: Once | INTRAMUSCULAR | Status: AC | PRN
  Administered 2018-11-21: 100 mL via INTRAVENOUS

## 2018-11-21 MED ORDER — LIDOCAINE HCL 1 % IJ SOLN
INTRAMUSCULAR | Status: AC
  Filled 2018-11-21: qty 20

## 2018-11-21 MED ORDER — SODIUM CHLORIDE (PF) 0.9 % IJ SOLN
INTRAMUSCULAR | Status: AC
  Filled 2018-11-21: qty 50

## 2018-11-21 NOTE — ED Notes (Signed)
Pt returned from CT °

## 2018-11-21 NOTE — ED Provider Notes (Addendum)
[facility]-EMERGENCY DEPT Provider Note   CSN: [id number] Arrival date & time: 11/21/18  1735    History   Chief Complaint Chief Complaint  Patient presents with  . Abdominal Pain  . Emesis  . Shortness of Breath    HPI Drew [name] is a 23 y.o. male.     23 year old male who presents with right upper and right lower quadrant abdominal pain x4 days.  Symptoms have become worse and described as dull ache that is worse with movement.  Denies any urinary symptoms.  He endorses nausea and vomiting but denies any fever or chills.  No urinary symptoms.  Symptoms have been progressively worse and came to the hospital 2 days ago but did not stay for evaluation.  No treatment use prior to arrival.  No prior history of same.     Past Medical History:  Diagnosis Date  . Abscess   . Asthma     There are no active problems to display for this patient.   Past Surgical History:  Procedure Laterality Date  . FRACTURE SURGERY          Home Medications    Prior to Admission medications   Medication Sig Start Date End Date Taking? Authorizing Provider  azithromycin (ZITHROMAX) 250 MG tablet Take 1 tablet (250 mg total) by mouth daily. Take first 2 tablets together, then 1 every day until finished. Patient not taking: Reported on 04/29/2018 02/13/18   Janne Napoleon, NP  benzonatate (TESSALON) 100 MG capsule Take 1 capsule (100 mg total) by mouth every 8 (eight) hours. Patient not taking: Reported on 04/29/2018 02/13/18   Janne Napoleon, NP  dicyclomine (BENTYL) 20 MG tablet Take 1 tablet (20 mg total) by mouth 2 (two) times daily. Patient not taking: Reported on 10/31/2018 09/12/18   Arthor Captain, PA-C  ondansetron [facility] ODT) 4 MG disintegrating tablet 4mg  ODT q4 hours prn nausea/vomit 10/31/18   Dartha Lodge, PA-C  ondansetron (ZOFRAN) 4 MG tablet Take 1 tablet (4 mg total) by mouth every 8 (eight) hours as needed for nausea or vomiting. Patient not  taking: Reported on 10/31/2018 09/12/18   Arthor Captain, PA-C  oxyCODONE-acetaminophen (PERCOCET/ROXICET) 5-325 MG tablet Take 1 tablet by mouth every 4 (four) hours as needed for severe pain. Patient not taking: Reported on 09/12/2018 04/29/18   Elpidio Anis, PA-C    Family History Family History  Adopted: Yes    Social History Social History   Tobacco Use  . Smoking status: Light Tobacco Smoker    Types: Cigarettes  . Smokeless tobacco: Never Used  Substance Use Topics  . Alcohol use: Not Currently  . Drug use: Not Currently     Allergies   Dairy aid [lactase]; Silver; and Chocolate   Review of Systems Review of Systems  All other systems reviewed and are negative.    Physical Exam Updated Vital Signs BP (!) 149/105   Pulse 71   Temp 98.9 F (37.2 C) (Oral)   SpO2 100%   Physical Exam Vitals signs and nursing note reviewed.  Constitutional:      General: He is not in acute distress.    Appearance: Normal appearance. He is well-developed. He is not toxic-appearing.  HENT:     Head: Normocephalic and atraumatic.  Eyes:     General: Lids are normal.     Conjunctiva/sclera: Conjunctivae normal.     Pupils: Pupils are equal, round, and reactive to light.  Neck:  Musculoskeletal: Normal range of motion and neck supple.     Thyroid: No thyroid mass.     Trachea: No tracheal deviation.  Cardiovascular:     Rate and Rhythm: Normal rate and regular rhythm.     Heart sounds: Normal heart sounds. No murmur. No gallop.   Pulmonary:     Effort: Pulmonary effort is normal. No respiratory distress.     Breath sounds: Normal breath sounds. No stridor. No decreased breath sounds, wheezing, rhonchi or rales.  Abdominal:     General: Bowel sounds are normal. There is no distension.     Palpations: Abdomen is soft.     Tenderness: There is abdominal tenderness in the right lower quadrant. There is guarding. There is no rebound.    Musculoskeletal: Normal range of  motion.        General: No tenderness.       Back:  Skin:    General: Skin is warm and dry.     Findings: No abrasion or rash.  Neurological:     Mental Status: He is alert and oriented to person, place, and time.     GCS: GCS eye subscore is 4. GCS verbal subscore is 5. GCS motor subscore is 6.     Cranial Nerves: No cranial nerve deficit.     Sensory: No sensory deficit.  Psychiatric:        Speech: Speech normal.        Behavior: Behavior normal.      ED Treatments / Results  Labs (all labs ordered are listed, but only abnormal results are displayed) Labs Reviewed  CBC WITH DIFFERENTIAL/PLATELET  COMPREHENSIVE METABOLIC PANEL  LIPASE, BLOOD    EKG None  Radiology No results found.  Procedures Procedures (including critical care time)  Medications Ordered in ED Medications  sodium chloride 0.9 % bolus 1,000 mL (has no administration in time range)  0.9 %  sodium chloride infusion (has no administration in time range)  morphine 4 MG/ML injection 6 mg (has no administration in time range)  ondansetron (ZOFRAN) injection 4 mg (has no administration in time range)     Initial Impression / Assessment and Plan / ED Course  I have reviewed the triage vital signs and the nursing notes.  Pertinent labs & imaging results that were available during my care of the patient were reviewed by me and considered in my medical decision making (see chart for details).        Patient medicated with IV fluids as well as pain meds and feels better.  Abdominal CT without evidence of appendicitis. Patient does have a prominent pancreatic duct but he has no epigastric tenderness.  Will give follow-up instructions for this.  CT did show a pilonidal cyst which was drained.  Return precautions given  INCISION AND DRAINAGE Performed by: Toy Baker Consent: Verbal consent obtained. Risks and benefits: risks, benefits and alternatives were discussed Type: abscess  Body area:  Sacrum   Anesthesia: local infiltration  Incision was made with a scalpel.  Local anesthetic: lidocaine 1% % plain epinephrine  Anesthetic total: 10 ml  Complexity: complex Blunt dissection to break up loculations  Drainage: purulent  Drainage amount: Mild  Packing material:  Patient tolerance: Patient tolerated the procedure well with no immediate complications.   Final Clinical Impressions(s) / ED Diagnoses   Final diagnoses:  None    ED Discharge Orders    None       Lorre Nick, MD 11/21/18 2048  Lorre Nick, MD 11/21/18 2053

## 2018-11-21 NOTE — Discharge Instructions (Addendum)
You have enlarged pancreatic duct that will possibly need to be followed up with an MRI if you continue to have abdominal pain.

## 2018-11-21 NOTE — ED Notes (Signed)
Pt to CT

## 2018-11-21 NOTE — ED Triage Notes (Signed)
Pt complains of emesis and abdominal pain x 3 days. Pt also reports occasional shortness of breath. Pt denies fever or cough.

## 2018-12-03 ENCOUNTER — Encounter (HOSPITAL_COMMUNITY): Payer: Self-pay | Admitting: Emergency Medicine

## 2018-12-03 ENCOUNTER — Emergency Department (HOSPITAL_COMMUNITY)
Admission: EM | Admit: 2018-12-03 | Discharge: 2018-12-03 | Disposition: A | Discharge status: Home / Self Care | Payer: Self-pay | Attending: Emergency Medicine | Admitting: Emergency Medicine

## 2018-12-03 ENCOUNTER — Other Ambulatory Visit: Payer: Self-pay

## 2018-12-03 DIAGNOSIS — R21 Rash and other nonspecific skin eruption: Secondary | ICD-10-CM | POA: Insufficient documentation

## 2018-12-03 DIAGNOSIS — L539 Erythematous condition, unspecified: Secondary | ICD-10-CM | POA: Insufficient documentation

## 2018-12-03 DIAGNOSIS — F17210 Nicotine dependence, cigarettes, uncomplicated: Secondary | ICD-10-CM | POA: Insufficient documentation

## 2018-12-03 DIAGNOSIS — R112 Nausea with vomiting, unspecified: Secondary | ICD-10-CM | POA: Insufficient documentation

## 2018-12-03 DIAGNOSIS — J45909 Unspecified asthma, uncomplicated: Secondary | ICD-10-CM | POA: Insufficient documentation

## 2018-12-03 DIAGNOSIS — T300 Burn of unspecified body region, unspecified degree: Secondary | ICD-10-CM | POA: Insufficient documentation

## 2018-12-03 DIAGNOSIS — K2970 Gastritis, unspecified, without bleeding: Secondary | ICD-10-CM | POA: Insufficient documentation

## 2018-12-03 DIAGNOSIS — Z23 Encounter for immunization: Secondary | ICD-10-CM | POA: Insufficient documentation

## 2018-12-03 LAB — COMPREHENSIVE METABOLIC PANEL
ALT: 15 U/L (ref 0–44)
AST: 17 U/L (ref 15–41)
Albumin: 4.4 g/dL (ref 3.5–5.0)
Alkaline Phosphatase: 59 U/L (ref 38–126)
Anion gap: 8 (ref 5–15)
BUN: 10 mg/dL (ref 6–20)
CO2: 28 mmol/L (ref 22–32)
Calcium: 9.3 mg/dL (ref 8.9–10.3)
Chloride: 103 mmol/L (ref 98–111)
Creatinine, Ser: 0.98 mg/dL (ref 0.61–1.24)
GFR calc Af Amer: >60 mL/min (ref >60)
GFR calc non Af Amer: >60 mL/min (ref >60)
Glucose, Bld: 115 mg/dL — ABNORMAL HIGH (ref 70–99)
Potassium: 3.9 mmol/L (ref 3.5–5.1)
Sodium: 139 mmol/L (ref 135–145)
Total Bilirubin: 1.1 mg/dL (ref 0.3–1.2)
Total Protein: 7.5 g/dL (ref 6.5–8.1)

## 2018-12-03 LAB — CBC WITH DIFFERENTIAL/PLATELET
Abs Immature Granulocytes: 0.04 10*3/uL (ref 0.00–0.07)
Basophils Absolute: 0.1 10*3/uL (ref 0.0–0.1)
Basophils Relative: 1 %
Eosinophils Absolute: 0.2 10*3/uL (ref 0.0–0.5)
Eosinophils Relative: 2 %
HCT: 47.2 % (ref 39.0–52.0)
Hemoglobin: 16.8 g/dL (ref 13.0–17.0)
Immature Granulocytes: 0 %
Lymphocytes Relative: 29 %
Lymphs Abs: 2.9 10*3/uL (ref 0.7–4.0)
MCH: 29.2 pg (ref 26.0–34.0)
MCHC: 35.6 g/dL (ref 30.0–36.0)
MCV: 82.1 fL (ref 80.0–100.0)
Monocytes Absolute: 0.7 10*3/uL (ref 0.1–1.0)
Monocytes Relative: 7 %
Neutro Abs: 6.1 10*3/uL (ref 1.7–7.7)
Neutrophils Relative %: 61 %
Platelets: 237 10*3/uL (ref 150–400)
RBC: 5.75 MIL/uL (ref 4.22–5.81)
RDW: 14.8 % (ref 11.5–15.5)
WBC: 10 10*3/uL (ref 4.0–10.5)
nRBC: 0 % (ref 0.0–0.2)

## 2018-12-03 LAB — LIPASE, BLOOD: Lipase: 54 U/L — ABNORMAL HIGH (ref 11–51)

## 2018-12-03 MED ORDER — ONDANSETRON 4 MG PO TBDP
4.0000 mg | ORAL_TABLET | Freq: Once | ORAL | Status: AC
  Administered 2018-12-03: 4 mg via ORAL
  Filled 2018-12-03: qty 1

## 2018-12-03 MED ORDER — DICYCLOMINE HCL 20 MG PO TABS: 20.0000 mg | ORAL_TABLET | Freq: Two times a day (BID) | ORAL | 0 refills | Status: DC

## 2018-12-03 MED ORDER — DICYCLOMINE HCL 10 MG/ML IM SOLN
20.0000 mg | Freq: Once | INTRAMUSCULAR | Status: AC
  Administered 2018-12-03: 13:00:00 20 mg via INTRAMUSCULAR
  Filled 2018-12-03: qty 2

## 2018-12-03 MED ORDER — TETANUS-DIPHTH-ACELL PERTUSSIS 5-2.5-18.5 LF-MCG/0.5 IM SUSP
0.5000 mL | Freq: Once | INTRAMUSCULAR | Status: AC
  Administered 2018-12-03: 0.5 mL via INTRAMUSCULAR
  Filled 2018-12-03: qty 0.5

## 2018-12-03 NOTE — Progress Notes (Addendum)
TOC CM spoke to pt and states he does not have insurance. He is currently working part-time at [employer]. Explained MATCH with $3 copay and once per year use. States he could not afford to get meds after his last ED visit. Contacted [facility] to arrange appt (closed for lunch). CM will arrange appt and give pt a call back with time if dc prior to appt being made. Isidoro Donning RN CCM Case Mgmt phone [phone]  12/03/2018 2:27 scheduled appt at [facility] for 12/11/2018, at 9:50 am. Will provided info to patient. Isidoro Donning RN CCM Case Mgmt phone [phone]

## 2018-12-03 NOTE — Discharge Instructions (Signed)
Continue using burn cream in the area. Take the Bentyl as needed to help with abdominal discomfort. Follow-up with your primary care provider. Return to ED if you start to have worsening symptoms, abdominal pain with fever, chest pain or shortness of breath.

## 2018-12-03 NOTE — ED Provider Notes (Signed)
[facility]-EMERGENCY DEPT Provider Note   CSN: [id number] Arrival date & time: 12/03/18  1129    History   Chief Complaint Chief Complaint  Patient presents with  . Abdominal Pain  . Emesis  . Rash    HPI Drew [name] is a 23 y.o. male presents to ED for 2-day history of ongoing generalized abdominal pain and vomiting.  He was seen and evaluated on 11/21/2018 with reassuring work-up.  CT of abdomen pelvis at that time showed prominent pancreatic duct with changes to lipase.  He states that his nonbloody, nonbilious emesis has slowly started to resolve.  He was given an antiemetic medication filled.  He states that he will have intermittent nausea when he tries to eat something.  Denies any changes to bowel movements or urination.  No sick contacts with similar symptoms.  He denies any alcohol, tobacco or other drug use.  No prior abdominal surgeries reported. Also states that he had increased swelling on his left leg, left above his right eyebrow yesterday while at work.  [place] was at a camp.  He cleansed the area and put over-the-counter burn cream in the area. No visual changes.     HPI  Past Medical History:  Diagnosis Date  . Abscess   . Asthma     There are no active problems to display for this patient.   Past Surgical History:  Procedure Laterality Date  . FRACTURE SURGERY          Home Medications    Prior to Admission medications   Medication Sig Start Date End Date Taking? Authorizing Provider  benzonatate (TESSALON) 100 MG capsule Take 1 capsule (100 mg total) by mouth every 8 (eight) hours. 02/13/18  Yes Neese, Hope M, NP  azithromycin (ZITHROMAX) 250 MG tablet Take 1 tablet (250 mg total) by mouth daily. Take first 2 tablets together, then 1 every day until finished. Patient not taking: Reported on 04/29/2018 02/13/18   Janne Napoleon, NP  dicyclomine (BENTYL) 20 MG tablet Take 1 tablet (20 mg total) by mouth 2 (two) times daily. 12/03/18    Sapna Padron, PA-C  doxycycline (VIBRAMYCIN) 100 MG capsule Take 1 capsule (100 mg total) by mouth 2 (two) times daily. 11/21/18   Lorre Nick, MD  HYDROcodone-acetaminophen (NORCO/VICODIN) 5-325 MG tablet Take 1-2 tablets by mouth every 4 (four) hours as needed. 11/21/18   Lorre Nick, MD  ondansetron (ZOFRAN ODT) 4 MG disintegrating tablet  ODT q4 hours prn nausea/vomit 10/31/18   Dartha Lodge, PA-C  ondansetron (ZOFRAN) 4 MG tablet Take 1 tablet (4 mg total) by mouth every 8 (eight) hours as needed for nausea or vomiting. Patient not taking: Reported on 10/31/2018 09/12/18   Arthor Captain, PA-C  oxyCODONE-acetaminophen (PERCOCET/ROXICET) 5-325 MG tablet Take 1 tablet by mouth every 4 (four) hours as needed for severe pain. Patient not taking: Reported on 09/12/2018 04/29/18   Elpidio Anis, PA-C    Family History Family History  Adopted: Yes    Social History Social History   Tobacco Use  . Smoking status: Light Tobacco Smoker    Types: Cigarettes  . Smokeless tobacco: Never Used  Substance Use Topics  . Alcohol use: Not Currently  . Drug use: Not Currently     Allergies   Dairy aid [lactase]; Silver; and Chocolate   Review of Systems Review of Systems  Constitutional: Negative for appetite change, chills and fever.  HENT: Negative for ear pain, rhinorrhea, sneezing and sore throat.  Eyes: Negative for photophobia and visual disturbance.  Respiratory: Negative for cough, chest tightness, shortness of breath and wheezing.   Cardiovascular: Negative for chest pain and palpitations.  Gastrointestinal: Positive for abdominal pain, nausea and vomiting. Negative for blood in stool, constipation and diarrhea.  Genitourinary: Negative for dysuria, hematuria and urgency.  Musculoskeletal: Negative for myalgias.  Skin: Negative for rash.  Neurological: Negative for dizziness, weakness and light-headedness.     Physical Exam Updated Vital Signs BP 128/84 (BP Location:  Right Arm)   Pulse 72   Temp 98.1 F (36.7 C) (Oral)   Resp 18   Ht 5' 6.5" (1.689 m)   Wt 75 kg   SpO2 96%   BMI 26.29 kg/m   Physical Exam Vitals signs and nursing note reviewed.  Constitutional:      General: He is not in acute distress.    Appearance: He is well-developed.  HENT:     Head: Normocephalic and atraumatic.     Nose: Nose normal.  Eyes:     General: No scleral icterus.       Left eye: No discharge.     Conjunctiva/sclera: Conjunctivae normal.  Neck:     Musculoskeletal: Normal range of motion and neck supple.  Cardiovascular:     Rate and Rhythm: Normal rate and regular rhythm.     Heart sounds: Normal heart sounds. No murmur. No friction rub. No gallop.   Pulmonary:     Effort: Pulmonary effort is normal. No respiratory distress.     Breath sounds: Normal breath sounds.  Abdominal:     General: Bowel sounds are normal. There is no distension.     Palpations: Abdomen is soft.     Tenderness: There is generalized abdominal tenderness. There is no guarding or rebound.  Musculoskeletal: Normal range of motion.  Skin:    General: Skin is warm and dry.     Findings: Erythema present. No rash.     Comments: Patches of first degree burn noted on L anterior leg, L arm and above R eyebrow. No blistering or signs of secondary infection noted. These cover <10% of TBSA.  Neurological:     Mental Status: He is alert.     Motor: No abnormal muscle tone.     Coordination: Coordination normal.      ED Treatments / Results  Labs (all labs ordered are listed, but only abnormal results are displayed) Labs Reviewed  COMPREHENSIVE METABOLIC PANEL - Abnormal; Notable for the following components:      Result Value   Glucose, Bld 115 (*)    All other components within normal limits  LIPASE, BLOOD - Abnormal; Notable for the following components:   Lipase 54 (*)    All other components within normal limits  CBC WITH DIFFERENTIAL/PLATELET    EKG None  Radiology  No results found.  Procedures Procedures (including critical care time)  Medications Ordered in ED Medications  dicyclomine (BENTYL) injection 20 mg (20 mg Intramuscular Given 12/03/18 1250)  ondansetron (ZOFRAN-ODT) disintegrating tablet 4 mg (4 mg Oral Given 12/03/18 1249)  Tdap (BOOSTRIX) injection 0.5 mL (0.5 mLs Intramuscular Given 12/03/18 1250)     Initial Impression / Assessment and Plan / ED Course  I have reviewed the triage vital signs and the nursing notes.  Pertinent labs & imaging results that were available during my care of the patient were reviewed by me and considered in my medical decision making (see chart for details).  23 year old male presents to ED for ongoing abdominal pain, nausea and vomiting for the past 2 weeks.  He was seen and evaluated when symptoms first began with reassuring work-up and CT scan.  States that he nausea and vomiting has partially resolved without antipyretics.  His abdominal pain is intermittent in general.  On my exam his healthy-appearing.  Vital signs within normal limits.  Abdomen is generally tender without rebound or guarding, no specific focal tenderness that is worsening.  He denies any changes to bowel movements or urination.  We will plan to obtain lab work, give Bentyl and Zofran and reassess. He also has several patches of first degree burns on extremities and face which he sustained last night with grease while at work. These cover <10% of TBSA. He has been using over-the-counter burn cream. 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  Repeat labs are reassuring. Lipase mildly elevated at 54, but no worsening pain in epigastric area.  His vital signs remained reassuring.  He has had improvement with medications given.  Will give prescription for Bentyl.  Unsure of the cause of his symptoms although I doubt surgical or emergent cause such as appendicitis, cholecystitis due to his  reassuring physical exam findings, lab work and improvement with medications.  Will give PCP follow-up.  He knows to return to ED for worsening symptoms.  We will have him continue over-the-counter burn cream.  Patient is hemodynamically stable, in NAD, and able to ambulate in the ED. Evaluation does not show pathology that would require ongoing emergent intervention or inpatient treatment. I explained the diagnosis to the patient. Pain has been managed and has no complaints prior to discharge. Patient is comfortable with above plan and is stable for discharge at this time. All questions were answered prior to disposition. Strict return precautions for returning to the ED were discussed. Encouraged follow up with PCP.   An After Visit Summary was printed and given to the patient.   Portions of this note were generated with Scientist, clinical (histocompatibility and immunogenetics). Dictation errors may occur despite best attempts at proofreading.   Final Clinical Impressions(s) / ED Diagnoses   Final diagnoses:  Gastritis without bleeding, unspecified chronicity, unspecified gastritis type  Superficial burn    ED Discharge Orders         Ordered    dicyclomine (BENTYL) 20 MG tablet  2 times daily     12/03/18 1301           Dietrich Pates, PA-C 12/03/18 1307    Raeford Razor, MD 12/03/18 1423

## 2018-12-03 NOTE — Progress Notes (Deleted)
TOC CM spoke to pt and states he does not have insurance. He is currently working part-time at [employer]. Explained MATCH with $3 copay and once per year use. States he could not afford to get meds after his last ED visit. Contacted [facility] to arrange appt (closed for lunch). CM will arrange appt and give pt a call back with time if dc prior to appt being made. Jadelin Eng RN CCM Case Mgmt phone [phone] 

## 2018-12-03 NOTE — Progress Notes (Deleted)
TOC CM spoke to pt and states he does not have insurance. He is currently working part-time at [employer]. Explained MATCH with $3 copay and once per year use. States he could not afford to get meds after his last ED visit. Contacted [facility] to arrange appt (closed for lunch). CM will arrange appt and give pt a call back with time if dc prior to appt being made. Isidoro Donning RN CCM Case Mgmt phone [phone]

## 2018-12-03 NOTE — ED Triage Notes (Signed)
Pt reports that still having abd pains and vomiting since was seen her couple weeks ago. Thinks he got burned at work last night because after he got home and out of shower noticed spot on face and left arm.

## 2018-12-11 ENCOUNTER — Inpatient Hospital Stay: Payer: Self-pay | Admitting: Primary Care

## 2019-04-10 ENCOUNTER — Other Ambulatory Visit: Payer: Self-pay

## 2019-04-10 ENCOUNTER — Encounter (HOSPITAL_COMMUNITY): Payer: Self-pay

## 2019-04-10 ENCOUNTER — Emergency Department (HOSPITAL_COMMUNITY)
Admission: EM | Admit: 2019-04-10 | Discharge: 2019-04-11 | Disposition: A | Discharge status: Home / Self Care | Payer: Self-pay | Attending: Emergency Medicine | Admitting: Emergency Medicine

## 2019-04-10 DIAGNOSIS — Z79899 Other long term (current) drug therapy: Secondary | ICD-10-CM | POA: Insufficient documentation

## 2019-04-10 DIAGNOSIS — J45909 Unspecified asthma, uncomplicated: Secondary | ICD-10-CM | POA: Insufficient documentation

## 2019-04-10 DIAGNOSIS — F17210 Nicotine dependence, cigarettes, uncomplicated: Secondary | ICD-10-CM | POA: Insufficient documentation

## 2019-04-10 DIAGNOSIS — R112 Nausea with vomiting, unspecified: Secondary | ICD-10-CM | POA: Insufficient documentation

## 2019-04-10 DIAGNOSIS — R140 Abdominal distension (gaseous): Secondary | ICD-10-CM | POA: Insufficient documentation

## 2019-04-10 DIAGNOSIS — R1084 Generalized abdominal pain: Secondary | ICD-10-CM | POA: Insufficient documentation

## 2019-04-10 MED ORDER — SODIUM CHLORIDE 0.9% FLUSH: 3.0000 mL | Freq: Once | INTRAVENOUS | Status: DC

## 2019-04-10 NOTE — ED Triage Notes (Signed)
Pt c/o intermittent generalized abdominal cramping x 1 week and emesis x 1 earlier today.  Pain score 8/10.  Denies diarrhea and constipation.

## 2019-04-11 ENCOUNTER — Emergency Department (HOSPITAL_COMMUNITY): Payer: Self-pay

## 2019-04-11 LAB — URINALYSIS, ROUTINE W REFLEX MICROSCOPIC
Bilirubin Urine: NEGATIVE
Glucose, UA: NEGATIVE mg/dL
Hgb urine dipstick: NEGATIVE
Ketones, ur: NEGATIVE mg/dL
Leukocytes,Ua: NEGATIVE
Nitrite: NEGATIVE
Protein, ur: NEGATIVE mg/dL
Specific Gravity, Urine: 1.009 (ref 1.005–1.030)
pH: 7 (ref 5.0–8.0)

## 2019-04-11 LAB — COMPREHENSIVE METABOLIC PANEL
ALT: 8 U/L (ref 0–44)
AST: 13 U/L — ABNORMAL LOW (ref 15–41)
Albumin: 3.9 g/dL (ref 3.5–5.0)
Alkaline Phosphatase: 54 U/L (ref 38–126)
Anion gap: 8 (ref 5–15)
BUN: 7 mg/dL (ref 6–20)
CO2: 28 mmol/L (ref 22–32)
Calcium: 9 mg/dL (ref 8.9–10.3)
Chloride: 105 mmol/L (ref 98–111)
Creatinine, Ser: 0.93 mg/dL (ref 0.61–1.24)
GFR calc Af Amer: >60 mL/min (ref >60)
GFR calc non Af Amer: >60 mL/min (ref >60)
Glucose, Bld: 139 mg/dL — ABNORMAL HIGH (ref 70–99)
Potassium: 3.5 mmol/L (ref 3.5–5.1)
Sodium: 141 mmol/L (ref 135–145)
Total Bilirubin: 0.6 mg/dL (ref 0.3–1.2)
Total Protein: 6.8 g/dL (ref 6.5–8.1)

## 2019-04-11 LAB — CBC
HCT: 44.7 % (ref 39.0–52.0)
Hemoglobin: 15.8 g/dL (ref 13.0–17.0)
MCH: 29.2 pg (ref 26.0–34.0)
MCHC: 35.3 g/dL (ref 30.0–36.0)
MCV: 82.6 fL (ref 80.0–100.0)
Platelets: 239 10*3/uL (ref 150–400)
RBC: 5.41 MIL/uL (ref 4.22–5.81)
RDW: 13.7 % (ref 11.5–15.5)
WBC: 9.3 10*3/uL (ref 4.0–10.5)
nRBC: 0 % (ref 0.0–0.2)

## 2019-04-11 LAB — LIPASE, BLOOD: Lipase: 49 U/L (ref 11–51)

## 2019-04-11 MED ORDER — DICYCLOMINE HCL 10 MG PO CAPS
20.0000 mg | ORAL_CAPSULE | Freq: Once | ORAL | Status: AC
  Administered 2019-04-11: 20 mg via ORAL
  Filled 2019-04-11: qty 2

## 2019-04-11 MED ORDER — ONDANSETRON 4 MG PO TBDP
4.0000 mg | ORAL_TABLET | Freq: Once | ORAL | Status: AC
  Administered 2019-04-11: 4 mg via ORAL
  Filled 2019-04-11: qty 1

## 2019-04-11 MED ORDER — DOCUSATE SODIUM 100 MG PO CAPS: 100.0000 mg | ORAL_CAPSULE | Freq: Two times a day (BID) | ORAL | 0 refills | Status: DC

## 2019-04-11 NOTE — ED Provider Notes (Signed)
[facility]-EMERGENCY DEPT Provider Note   CSN: [id number] Arrival date & time: 04/10/19  2319     History   Chief Complaint Chief Complaint  Patient presents with  . Abdominal Pain  . Emesis    HPI Drew [name] is a 23 y.o. male.     The history is provided by the patient and medical records.  Abdominal Pain Associated symptoms: nausea and vomiting   Emesis Associated symptoms: abdominal pain      23 y.o. M with hx of abscesses and asthma, presenting to the ED for abdominal pain.  States he has been having these symptoms for approx 3 weeks.  States his stomach feels full and bloated.  States he is able to eat and drink but sometimes he throws up.  He has had decreased bowel movements from normal.  Denies any melena or hematochezia.  States he has been smoking weed to control his abdominal pain which helps sometimes.  No history of abdominal surgeries.  Past Medical History:  Diagnosis Date  . Abscess   . Asthma     There are no active problems to display for this patient.   Past Surgical History:  Procedure Laterality Date  . FRACTURE SURGERY          Home Medications    Prior to Admission medications   Medication Sig Start Date End Date Taking? Authorizing Provider  azithromycin (ZITHROMAX) 250 MG tablet Take 1 tablet (250 mg total) by mouth daily. Take first 2 tablets together, then 1 every day until finished. Patient not taking: Reported on 04/29/2018 02/13/18   Janne NapoleonNeese, Hope M, NP  benzonatate (TESSALON) 100 MG capsule Take 1 capsule (100 mg total) by mouth every 8 (eight) hours. 02/13/18   Janne NapoleonNeese, Hope M, NP  dicyclomine (BENTYL) 20 MG tablet Take 1 tablet (20 mg total) by mouth 2 (two) times daily. 12/03/18   Khatri, Hina, PA-C  doxycycline (VIBRAMYCIN) 100 MG capsule Take 1 capsule (100 mg total) by mouth 2 (two) times daily. 11/21/18   Lorre NickAllen, Anthony, MD  HYDROcodone-acetaminophen (NORCO/VICODIN) 5-325 MG tablet Take 1-2 tablets by  mouth every 4 (four) hours as needed. 11/21/18   Lorre NickAllen, Anthony, MD  ondansetron (ZOFRAN ODT) 4 MG disintegrating tablet 4mg  ODT q4 hours prn nausea/vomit 10/31/18   Dartha LodgeFord, Kelsey N, PA-C  ondansetron (ZOFRAN) 4 MG tablet Take 1 tablet (4 mg total) by mouth every 8 (eight) hours as needed for nausea or vomiting. Patient not taking: Reported on 10/31/2018 09/12/18   Arthor CaptainHarris, Abigail, PA-C  oxyCODONE-acetaminophen (PERCOCET/ROXICET) 5-325 MG tablet Take 1 tablet by mouth every 4 (four) hours as needed for severe pain. Patient not taking: Reported on 09/12/2018 04/29/18   Elpidio AnisUpstill, Shari, PA-C    Family History Family History  Adopted: Yes    Social History Social History   Tobacco Use  . Smoking status: Light Tobacco Smoker    Types: Cigarettes  . Smokeless tobacco: Never Used  Substance Use Topics  . Alcohol use: Not Currently  . Drug use: Yes    Types: Marijuana     Allergies   Dairy aid [lactase], Silver, and Chocolate   Review of Systems Review of Systems  Gastrointestinal: Positive for abdominal pain, nausea and vomiting.  All other systems reviewed and are negative.    Physical Exam Updated Vital Signs BP 129/77 (BP Location: Right Arm)   Pulse 95   Temp 98.3 F (36.8 C) (Oral)   Resp 18   Ht 5\' 6"  (1.676 m)  SpO2 98%   BMI 26.69 kg/m   Physical Exam Vitals signs and nursing note reviewed.  Constitutional:      Appearance: He is well-developed.     Comments: Laying on Biomedical scientist, texting on cell phone  HENT:     Head: Normocephalic and atraumatic.  Eyes:     Conjunctiva/sclera: Conjunctivae normal.     Pupils: Pupils are equal, round, and reactive to light.  Neck:     Musculoskeletal: Normal range of motion.  Cardiovascular:     Rate and Rhythm: Normal rate and regular rhythm.     Heart sounds: Normal heart sounds.  Pulmonary:     Effort: Pulmonary effort is normal.     Breath sounds: Normal breath sounds.  Abdominal:     General: Bowel sounds are normal.      Palpations: Abdomen is soft.     Tenderness: There is no abdominal tenderness.     Comments: Soft, non-tender, normal bowel sounds  Musculoskeletal: Normal range of motion.  Skin:    General: Skin is warm and dry.  Neurological:     Mental Status: He is alert and oriented to person, place, and time.      ED Treatments / Results  Labs (all labs ordered are listed, but only abnormal results are displayed) Labs Reviewed  COMPREHENSIVE METABOLIC PANEL - Abnormal; Notable for the following components:      Result Value   Glucose, Bld 139 (*)    AST 13 (*)    All other components within normal limits  LIPASE, BLOOD  CBC  URINALYSIS, ROUTINE W REFLEX MICROSCOPIC    EKG None  Radiology No results found.  Procedures Procedures (including critical care time)  Medications Ordered in ED Medications  sodium chloride flush (NS) 0.9 % injection 3 mL (has no administration in time range)  ondansetron (ZOFRAN-ODT) disintegrating tablet 4 mg (4 mg Oral Given 04/11/19 0110)  dicyclomine (BENTYL) capsule 20 mg (20 mg Oral Given 04/11/19 0109)     Initial Impression / Assessment and Plan / ED Course  I have reviewed the triage vital signs and the nursing notes.  Pertinent labs & imaging results that were available during my care of the patient were reviewed by me and considered in my medical decision making (see chart for details).  23 year old male here with 3 weeks of abdominal pain.  Reports bowel movements are decreased from baseline, intermittent nausea and vomiting.  Has been using marijuana to ease his pain.  He is afebrile and nontoxic, no acute distress.  His exam is overall benign and.  Labs are reassuring.  Acute abdominal series with some retained stool but no obstructive findings.  Patient is stable for discharge.  We will have him start stool softener, high-fiber diet.  Will need follow-up with PCP.  Return here for any new or acute changes.  Final Clinical Impressions(s)  / ED Diagnoses   Final diagnoses:  Generalized abdominal pain    ED Discharge Orders         Ordered    docusate sodium (COLACE) 100 MG capsule  Every 12 hours     04/11/19 0144           Larene Pickett, PA-C 04/11/19 2992    Rolland Porter, MD 04/11/19 [phone]

## 2019-04-11 NOTE — Discharge Instructions (Signed)
Recommend starting colace now, take until bowel movements regulate.  Then can reduce to as needed dosing. Make sure to drink plenty of water and eat high fiber diet. Follow-up with your primary care doctor. Return here for any acute changes.

## 2019-04-11 NOTE — ED Notes (Signed)
Pt not able to provide urine sample at this time but will use call light once he is able to.

## 2019-05-14 ENCOUNTER — Encounter (HOSPITAL_COMMUNITY): Payer: Self-pay

## 2019-05-14 ENCOUNTER — Other Ambulatory Visit: Payer: Self-pay

## 2019-05-14 DIAGNOSIS — F17210 Nicotine dependence, cigarettes, uncomplicated: Secondary | ICD-10-CM | POA: Insufficient documentation

## 2019-05-14 DIAGNOSIS — R112 Nausea with vomiting, unspecified: Secondary | ICD-10-CM | POA: Insufficient documentation

## 2019-05-14 DIAGNOSIS — K5900 Constipation, unspecified: Secondary | ICD-10-CM | POA: Insufficient documentation

## 2019-05-14 DIAGNOSIS — R1030 Lower abdominal pain, unspecified: Secondary | ICD-10-CM | POA: Insufficient documentation

## 2019-05-14 DIAGNOSIS — J45909 Unspecified asthma, uncomplicated: Secondary | ICD-10-CM | POA: Insufficient documentation

## 2019-05-14 LAB — CBC
HCT: 48.5 % (ref 39.0–52.0)
Hemoglobin: 16.9 g/dL (ref 13.0–17.0)
MCH: 29.1 pg (ref 26.0–34.0)
MCHC: 34.8 g/dL (ref 30.0–36.0)
MCV: 83.5 fL (ref 80.0–100.0)
Platelets: 264 10*3/uL (ref 150–400)
RBC: 5.81 MIL/uL (ref 4.22–5.81)
RDW: 14.1 % (ref 11.5–15.5)
WBC: 10 10*3/uL (ref 4.0–10.5)
nRBC: 0 % (ref 0.0–0.2)

## 2019-05-14 MED ORDER — SODIUM CHLORIDE 0.9% FLUSH
3.0000 mL | Freq: Once | INTRAVENOUS | Status: AC
  Administered 2019-05-15: 08:00:00 3 mL via INTRAVENOUS

## 2019-05-14 NOTE — ED Triage Notes (Signed)
Pt reports R sided abdominal pain that radiates down to his pubic region. States that the pain just started when he was laying down, so he went to have a BM and the pain worsened and has not let up. Denies vomiting, but endorses nausea.

## 2019-05-15 ENCOUNTER — Emergency Department (HOSPITAL_COMMUNITY)
Admission: EM | Admit: 2019-05-15 | Discharge: 2019-05-15 | Disposition: A | Discharge status: Home / Self Care | Payer: Self-pay | Attending: Emergency Medicine | Admitting: Emergency Medicine

## 2019-05-15 ENCOUNTER — Emergency Department (HOSPITAL_COMMUNITY): Payer: Self-pay

## 2019-05-15 DIAGNOSIS — R1030 Lower abdominal pain, unspecified: Secondary | ICD-10-CM

## 2019-05-15 LAB — COMPREHENSIVE METABOLIC PANEL
ALT: 11 U/L (ref 0–44)
AST: 15 U/L (ref 15–41)
Albumin: 4.3 g/dL (ref 3.5–5.0)
Alkaline Phosphatase: 57 U/L (ref 38–126)
Anion gap: 9 (ref 5–15)
BUN: 8 mg/dL (ref 6–20)
CO2: 27 mmol/L (ref 22–32)
Calcium: 9.2 mg/dL (ref 8.9–10.3)
Chloride: 105 mmol/L (ref 98–111)
Creatinine, Ser: 0.88 mg/dL (ref 0.61–1.24)
GFR calc Af Amer: >60 mL/min (ref >60)
GFR calc non Af Amer: >60 mL/min (ref >60)
Glucose, Bld: 99 mg/dL (ref 70–99)
Potassium: 4 mmol/L (ref 3.5–5.1)
Sodium: 141 mmol/L (ref 135–145)
Total Bilirubin: 0.8 mg/dL (ref 0.3–1.2)
Total Protein: 7.3 g/dL (ref 6.5–8.1)

## 2019-05-15 LAB — URINALYSIS, ROUTINE W REFLEX MICROSCOPIC
Bacteria, UA: NONE SEEN
Bilirubin Urine: NEGATIVE
Glucose, UA: NEGATIVE mg/dL
Hgb urine dipstick: NEGATIVE
Ketones, ur: NEGATIVE mg/dL
Nitrite: NEGATIVE
Protein, ur: NEGATIVE mg/dL
Specific Gravity, Urine: 1.014 (ref 1.005–1.030)
pH: 5 (ref 5.0–8.0)

## 2019-05-15 LAB — LIPASE, BLOOD: Lipase: 36 U/L (ref 11–51)

## 2019-05-15 MED ORDER — ONDANSETRON HCL 4 MG/2ML IJ SOLN
4.0000 mg | Freq: Once | INTRAMUSCULAR | Status: AC
  Administered 2019-05-15: 09:00:00 4 mg via INTRAVENOUS
  Filled 2019-05-15: qty 2

## 2019-05-15 MED ORDER — MORPHINE SULFATE (PF) 4 MG/ML IV SOLN
4.0000 mg | Freq: Once | INTRAVENOUS | Status: AC
  Administered 2019-05-15: 08:00:00 4 mg via INTRAVENOUS
  Filled 2019-05-15: qty 1

## 2019-05-15 MED ORDER — SODIUM CHLORIDE (PF) 0.9 % IJ SOLN
INTRAMUSCULAR | Status: AC
  Administered 2019-05-15: 09:00:00
  Filled 2019-05-15: qty 50

## 2019-05-15 MED ORDER — SODIUM CHLORIDE 0.9 % IV BOLUS
1000.0000 mL | Freq: Once | INTRAVENOUS | Status: AC
  Administered 2019-05-15: 1000 mL via INTRAVENOUS

## 2019-05-15 MED ORDER — IOHEXOL 300 MG/ML  SOLN
100.0000 mL | Freq: Once | INTRAMUSCULAR | Status: AC | PRN
  Administered 2019-05-15: 08:00:00 100 mL via INTRAVENOUS

## 2019-05-15 NOTE — Discharge Instructions (Addendum)
Your CT results were within normal limits today.  Your laboratory results were also within normal limits.  Suspicion that your pain is likely chronic at this time, you will need to schedule an appointment with gastroenterology. Contact information for GI has been added to your chart, please schedule an appointment at as needed.  If you

## 2019-05-15 NOTE — ED Provider Notes (Signed)
[place] DEPT Provider Note   CSN: [id number] Arrival date & time: 05/14/19  2011     History   Chief Complaint Chief Complaint  Patient presents with   Abdominal Pain    HPI Drew [name] is a 23 y.o. male.     23 y.o male with a PMH of Asthma presents to the ED with a chief complaint of lower abdominal pain x 1 week. Patient describes this as an intermittent sharp sensation to the suprapubic region with radiation to the right lower quadrant. He reports the pain has now become focal to the RLQ. He reports this pain was changed with bowel movement. He also reports not having a bowel movement in 4 days. No alleviating factors. He has tried tylenol but reports pain worsen 2 days ago. He endorses nausea along with vomiting. His last meal was at 2pm, he reports food makes some of his pain worse. He reports being seen for the same complaint back in March 2020, reports this pain was different than his usual.  He denies any fevers, past surgical history to his abdomen, urinary symptoms, shortness of breath or other complaints.   The history is provided by the patient and medical records.  Abdominal Pain Associated symptoms: constipation, nausea and vomiting   Associated symptoms: no chest pain, no chills, no cough, no diarrhea, no dysuria, no fever, no hematuria, no shortness of breath and no sore throat     Past Medical History:  Diagnosis Date   Abscess    Asthma     There are no active problems to display for this patient.   Past Surgical History:  Procedure Laterality Date   FRACTURE SURGERY          Home Medications    Prior to Admission medications   Not on File    Family History Family History  Adopted: Yes    Social History Social History   Tobacco Use   Smoking status: Light Tobacco Smoker    Types: Cigarettes   Smokeless tobacco: Never Used  Substance Use Topics   Alcohol use: Not Currently   Drug use:  Yes    Types: Marijuana     Allergies   Dairy aid [lactase], Silver, and Chocolate   Review of Systems Review of Systems  Constitutional: Negative for chills and fever.  HENT: Negative for ear pain and sore throat.   Eyes: Negative for pain and visual disturbance.  Respiratory: Negative for cough and shortness of breath.   Cardiovascular: Negative for chest pain and palpitations.  Gastrointestinal: Positive for abdominal pain, constipation, nausea and vomiting. Negative for blood in stool and diarrhea.  Genitourinary: Negative for dysuria and hematuria.  Musculoskeletal: Negative for arthralgias and back pain.  Skin: Negative for color change and rash.  Neurological: Negative for seizures and syncope.  All other systems reviewed and are negative.    Physical Exam Updated Vital Signs BP 117/77    Pulse (!) 58    Temp 98.3 F (36.8 C) (Oral)    Resp 15    Ht 5\' 6"  (1.676 m)    Wt 75.2 kg    SpO2 98%    BMI 26.76 kg/m   Physical Exam Vitals signs and nursing note reviewed.  Constitutional:      Appearance: He is well-developed.  HENT:     Head: Normocephalic and atraumatic.  Eyes:     General: No scleral icterus.    Pupils: Pupils are equal, round, and reactive to  light.  Neck:     Musculoskeletal: Normal range of motion.  Cardiovascular:     Rate and Rhythm: Normal rate.     Heart sounds: Normal heart sounds.  Pulmonary:     Effort: Pulmonary effort is normal.     Breath sounds: Normal breath sounds. No wheezing.  Chest:     Chest wall: No tenderness.  Abdominal:     General: Bowel sounds are decreased. There is distension.     Palpations: Abdomen is soft.     Tenderness: There is abdominal tenderness in the right lower quadrant and suprapubic area. There is guarding. There is no right CVA tenderness or left CVA tenderness.     Hernia: No hernia is present.  Musculoskeletal:        General: No tenderness or deformity.  Skin:    General: Skin is warm and dry.    Neurological:     Mental Status: He is alert and oriented to person, place, and time.      ED Treatments / Results  Labs (all labs ordered are listed, but only abnormal results are displayed) Labs Reviewed  URINALYSIS, ROUTINE W REFLEX MICROSCOPIC - Abnormal; Notable for the following components:      Result Value   Leukocytes,Ua TRACE (*)    All other components within normal limits  LIPASE, BLOOD  COMPREHENSIVE METABOLIC PANEL  CBC    EKG None  Radiology Ct Abdomen Pelvis W Contrast  Result Date: 05/15/2019 CLINICAL DATA:  Pt reports R sided abdominal pain that radiates down to his pubic region. States that the pain just started when he was laying down, so he went to have a BM and the pain worsened and has not let up. Denies vomiting, but endorses nausea. EXAM: CT ABDOMEN AND PELVIS WITH CONTRAST TECHNIQUE: Multidetector CT imaging of the abdomen and pelvis was performed using the standard protocol following bolus administration of intravenous contrast. CONTRAST:  OMNIPAQUE IOHEXOL 300 MG/ML  SOLN COMPARISON:  11/21/2018 FINDINGS: Lower chest: Clear lung bases.  Heart normal in size. Hepatobiliary: No focal liver abnormality is seen. No gallstones, gallbladder wall thickening, or biliary dilatation. Pancreas: Unremarkable. No pancreatic ductal dilatation or surrounding inflammatory changes. Spleen: Normal in size without focal abnormality. Adrenals/Urinary Tract: Adrenal glands are unremarkable. Kidneys are normal, without renal calculi, focal lesion, or hydronephrosis. Bladder is unremarkable. Stomach/Bowel: Stomach is within normal limits. Appendix appears normal. No evidence of bowel wall thickening, distention, or inflammatory changes. Vascular/Lymphatic: No vascular abnormality prominent inguinal lymph nodes, largest on the right measuring 12 mm in short axis, presumed reactive. No other enlarged lymph nodes. Reproductive: Unremarkable. Other: No abdominal wall hernia or  abnormality. No abdominopelvic ascites. Musculoskeletal: No acute or significant osseous findings. IMPRESSION: 1. No acute findings. Normal appendix visualized. Prominent inguinal lymph nodes presumed reactive, but larger than on the prior CT. No other abnormalities. Electronically Signed   By: Amie Portland M.D.   On: 05/15/2019 08:47    Procedures Procedures (including critical care time)  Medications Ordered in ED Medications  sodium chloride flush (NS) 0.9 % injection 3 mL (3 mLs Intravenous Given 05/15/19 0754)  sodium chloride 0.9 % bolus 1,000 mL (0 mLs Intravenous Stopped 05/15/19 0944)  morphine 4 MG/ML injection 4 mg (4 mg Intravenous Given 05/15/19 0755)  iohexol (OMNIPAQUE) 300 MG/ML solution 100 mL (100 mLs Intravenous Contrast Given 05/15/19 0803)  sodium chloride (PF) 0.9 % injection (  Given by Other 05/15/19 0908)  ondansetron (ZOFRAN) injection 4 mg (4 mg  Intravenous Given 05/15/19 0910)     Initial Impression / Assessment and Plan / ED Course  I have reviewed the triage vital signs and the nursing notes.  Pertinent labs & imaging results that were available during my care of the patient were reviewed by me and considered in my medical decision making (see chart for details).       Patient with a past medical history of asthma presents to the ED with complaints of lower abdominal pain, pain has been going on for a week and a half now has localized to the right lower quadrant.  Patient endorses nausea, vomiting.  He has not taken anything aside from Tylenol to help with his pain.  CMP with normal electrolytes. Creatine level is within normal limits.  LFTs are unremarkable.  Lipase level within normal limit.  CBC without any leukocytosis, hemoglobin is within normal limits.  UA showed trace of leukocytes, 0-5 white blood cell count, denies any urinary symptoms at this time.  Differential diagnosis included but not limited to gastritis, appendicitis, diverticulitis. Patient was  provided with pain medication morphine, fluid bolus and Zofran for his nausea.  A CT of his abdomen was obtained as patient endorses significant pain to the right lower quadrant, states "I think is my appendix ".  CT of his abdomen revealed: 1. No acute findings. Normal appendix visualized. Prominent inguinal  lymph nodes presumed reactive, but larger than on the prior CT. No  other abnormalities.     These results were discussed with patient, he will be given a referral for GI as this is a recurrent right-sided pain however this time it progressed onto his right side. Will PO challenge   10:09 AM Patient was reassessed by me, no further emesis episodes while in the ED.  Patient otherwise nontoxic appearing, afebrile, with stable vital signs patient stable for discharge.   Portions of this note were generated with Scientist, clinical (histocompatibility and immunogenetics)Dragon dictation software. Dictation errors may occur despite best attempts at proofreading.  Final Clinical Impressions(s) / ED Diagnoses   Final diagnoses:  Lower abdominal pain    ED Discharge Orders    None       Claude MangesSoto, Shadee Montoya, PA-C 05/15/19 1010    Painteruratolo, Adam, DO 05/15/19 1855

## 2019-05-15 NOTE — ED Notes (Signed)
Patient ambulated to restroom without complication or assistance. 

## 2019-05-28 ENCOUNTER — Emergency Department (HOSPITAL_COMMUNITY)
Admission: EM | Admit: 2019-05-28 | Discharge: 2019-05-28 | Disposition: A | Discharge status: Home / Self Care | Payer: Self-pay | Attending: Emergency Medicine | Admitting: Emergency Medicine

## 2019-05-28 ENCOUNTER — Encounter (HOSPITAL_COMMUNITY): Payer: Self-pay | Admitting: Emergency Medicine

## 2019-05-28 DIAGNOSIS — Z20828 Contact with and (suspected) exposure to other viral communicable diseases: Secondary | ICD-10-CM | POA: Insufficient documentation

## 2019-05-28 DIAGNOSIS — F17210 Nicotine dependence, cigarettes, uncomplicated: Secondary | ICD-10-CM | POA: Insufficient documentation

## 2019-05-28 DIAGNOSIS — B9789 Other viral agents as the cause of diseases classified elsewhere: Secondary | ICD-10-CM

## 2019-05-28 DIAGNOSIS — J988 Other specified respiratory disorders: Secondary | ICD-10-CM | POA: Insufficient documentation

## 2019-05-28 DIAGNOSIS — J45909 Unspecified asthma, uncomplicated: Secondary | ICD-10-CM | POA: Insufficient documentation

## 2019-05-28 MED ORDER — ALBUTEROL SULFATE HFA 108 (90 BASE) MCG/ACT IN AERS
2.0000 | INHALATION_SPRAY | Freq: Once | RESPIRATORY_TRACT | Status: AC
  Administered 2019-05-28: 18:00:00 2 via RESPIRATORY_TRACT
  Filled 2019-05-28: qty 6.7

## 2019-05-28 MED ORDER — PREDNISONE 20 MG PO TABS: 40.0000 mg | ORAL_TABLET | Freq: Every day | ORAL | 0 refills | Status: DC

## 2019-05-28 NOTE — ED Triage Notes (Signed)
Per pt, states he thinks he has bronchitis-states sore throat, congestion since last Sunday-has not been exposed to anyone with Covid

## 2019-05-28 NOTE — Discharge Instructions (Signed)
Please read and follow all provided instructions.  Your diagnoses today include:  1. Viral respiratory illness     You appear to have an upper respiratory infection (URI). An upper respiratory tract infection, or cold, is a viral infection of the air passages leading to the lungs. It should improve gradually after 5-7 days. You may have a lingering cough that lasts for 2- 4 weeks after the infection.  Tests performed today include:  Vital signs. See below for your results today.   COVID testing - will return tomorrow  Medications prescribed:   Albuterol inhaler - medication that opens up your airway  Use inhaler as follows: 1-2 puffs with spacer every 4 hours as needed for wheezing, cough, or shortness of breath.    Prednisone - steroid medicine   It is best to take this medication in the morning to prevent sleeping problems. If you are diabetic, monitor your blood sugar closely and stop taking Prednisone if blood sugar is over 300. Take with food to prevent stomach upset.   Take any prescribed medications only as directed. Treatment for your infection is aimed at treating the symptoms. There are no medications, such as antibiotics, that will cure your infection.   Home care instructions:  Follow any educational materials contained in this packet.   Your illness is contagious and can be spread to others, especially during the first 3 or 4 days. It cannot be cured by antibiotics or other medicines. Take basic precautions such as washing your hands often, covering your mouth when you cough or sneeze, and avoiding public places where you could spread your illness to others.   Please continue drinking plenty of fluids.  Use over-the-counter medicines as needed as directed on packaging for symptom relief.  You may also use ibuprofen or tylenol as directed on packaging for pain or fever.  Do not take multiple medicines containing Tylenol or acetaminophen to avoid taking too much of this  medication.  Follow-up instructions: Please follow-up with your primary care provider in the next 3 days for further evaluation of your symptoms if you are not feeling better.   Return instructions:   Please return to the Emergency Department if you experience worsening symptoms.   RETURN IMMEDIATELY IF you develop shortness of breath, confusion or altered mental status, a new rash, become dizzy, faint, or poorly responsive, or are unable to be cared for at home.  Please return if you have persistent vomiting and cannot keep down fluids or develop a fever that is not controlled by tylenol or motrin.    Please return if you have any other emergent concerns.  Additional Information:  Your vital signs today were: BP 137/86 (BP Location: Left Arm)    Pulse 82    Temp 98.8 F (37.1 C) (Oral)    Resp 16    SpO2 98%  If your blood pressure (BP) was elevated above 135/85 this visit, please have this repeated by your doctor within one month. --------------

## 2019-05-28 NOTE — ED Provider Notes (Signed)
[facility]-EMERGENCY DEPT Provider Note   CSN: [id number] Arrival date & time: 05/28/19  1336     History   Chief Complaint Chief Complaint  Patient presents with  . Sore Throat    HPI Drew [name] is a 23 y.o. male.     Patient with history of asthma presents the emergency department with complaint of upper respiratory infection and cough.  Patient states that he has had cough and wheezing, shortness of breath, decreased sense of smell, sore throat, generalized body aches over the past 5 days.  He denies any sick contacts or coronavirus contacts.  He denies any fever, nausea, vomiting, or diarrhea.  He states that he woke up early this morning was having difficulty breathing and was wheezing.  States that this feels like previous episodes of bronchitis and asthma.  He is not currently taking any medications for this.  Onset of symptoms acute.  Course is constant.     Past Medical History:  Diagnosis Date  . Abscess   . Asthma     There are no active problems to display for this patient.   Past Surgical History:  Procedure Laterality Date  . FRACTURE SURGERY          Home Medications    Prior to Admission medications   Not on File    Family History Family History  Adopted: Yes    Social History Social History   Tobacco Use  . Smoking status: Light Tobacco Smoker    Types: Cigarettes  . Smokeless tobacco: Never Used  Substance Use Topics  . Alcohol use: Not Currently  . Drug use: Yes    Types: Marijuana     Allergies   Dairy aid [lactase], Silver, and Chocolate   Review of Systems Review of Systems  Constitutional: Positive for fatigue. Negative for chills and fever.  HENT: Positive for congestion, rhinorrhea and sore throat. Negative for ear pain and sinus pressure.   Eyes: Negative for redness.  Respiratory: Positive for cough, shortness of breath and wheezing.   Gastrointestinal: Negative for abdominal pain,  diarrhea, nausea and vomiting.  Genitourinary: Negative for dysuria.  Musculoskeletal: Positive for myalgias. Negative for neck stiffness.  Skin: Negative for rash.  Neurological: Negative for headaches.  Hematological: Negative for adenopathy.     Physical Exam Updated Vital Signs BP 137/86 (BP Location: Left Arm)   Pulse 82   Temp 98.8 F (37.1 C) (Oral)   Resp 16   SpO2 98%   Physical Exam Vitals signs and nursing note reviewed.  Constitutional:      Appearance: He is well-developed.  HENT:     Head: Normocephalic and atraumatic.     Jaw: No trismus.     Right Ear: Tympanic membrane, ear canal and external ear normal.     Left Ear: Tympanic membrane, ear canal and external ear normal.     Nose: Nose normal. No mucosal edema or rhinorrhea.     Mouth/Throat:     Mouth: Mucous membranes are moist. Mucous membranes are not dry.     Pharynx: Uvula midline. Posterior oropharyngeal erythema present. No oropharyngeal exudate or uvula swelling.     Tonsils: No tonsillar abscesses.  Eyes:     General:        Right eye: No discharge.        Left eye: No discharge.     Conjunctiva/sclera: Conjunctivae normal.  Neck:     Musculoskeletal: Normal range of motion and neck supple.  Cardiovascular:     Rate and Rhythm: Normal rate and regular rhythm.     Heart sounds: Normal heart sounds.  Pulmonary:     Effort: Pulmonary effort is normal. No respiratory distress.     Breath sounds: Normal breath sounds. No wheezing or rales.  Abdominal:     Palpations: Abdomen is soft.     Tenderness: There is no abdominal tenderness.  Skin:    General: Skin is warm and dry.  Neurological:     Mental Status: He is alert.      ED Treatments / Results  Labs (all labs ordered are listed, but only abnormal results are displayed) Labs Reviewed  SARS CORONAVIRUS 2 (TAT 6-24 HRS)    EKG None  Radiology No results found.  Procedures Procedures (including critical care time)   Medications Ordered in ED Medications  albuterol (VENTOLIN HFA) 108 (90 Base) MCG/ACT inhaler 2 puff (has no administration in time range)     Initial Impression / Assessment and Plan / ED Course  I have reviewed the triage vital signs and the nursing notes.  Pertinent labs & imaging results that were available during my care of the patient were reviewed by me and considered in my medical decision making (see chart for details).        Patient seen and examined.  Overall appears well on exam.  Vitals normal.  Suspect viral respiratory illness.  Cannot rule out coronavirus.  Will give patient an inhaler to use at home and was started on a burst of steroids.  Will check coronavirus testing.  Patient will be written out of the weekend.  He will follow-up on COVID testing tomorrow.  No indications for admission.  Encouraged return with worsening symptoms, worsening shortness of breath, trouble breathing, increased work of breathing, or other concerns.  Vital signs reviewed and are as follows: BP 137/86 (BP Location: Left Arm)   Pulse 82   Temp 98.8 F (37.1 C) (Oral)   Resp 16   SpO2 98%     Final Clinical Impressions(s) / ED Diagnoses   Final diagnoses:  Viral respiratory illness   Patient with symptoms consistent with a viral syndrome. Will need to r/o COVID-19. Vitals are stable, no fever. No signs of dehydration. Lung exam normal, no signs of pneumonia. Supportive therapy indicated with return if symptoms worsen.     ED Discharge Orders         Ordered    predniSONE (DELTASONE) 20 MG tablet  Daily     05/28/19 1716           Carlisle Cater, PA-C 05/28/19 1717    Valarie Merino, MD 05/28/19 2312

## 2019-05-29 LAB — SARS CORONAVIRUS 2 (TAT 6-24 HRS): SARS Coronavirus 2: NEGATIVE

## 2019-06-16 ENCOUNTER — Emergency Department (HOSPITAL_COMMUNITY)
Admission: EM | Admit: 2019-06-16 | Discharge: 2019-06-17 | Disposition: A | Discharge status: Home / Self Care | Payer: 59 | Attending: Emergency Medicine | Admitting: Emergency Medicine

## 2019-06-16 ENCOUNTER — Encounter (HOSPITAL_COMMUNITY): Payer: Self-pay

## 2019-06-16 ENCOUNTER — Emergency Department (HOSPITAL_COMMUNITY): Payer: 59

## 2019-06-16 DIAGNOSIS — S0990XA Unspecified injury of head, initial encounter: Secondary | ICD-10-CM | POA: Diagnosis present

## 2019-06-16 DIAGNOSIS — F17210 Nicotine dependence, cigarettes, uncomplicated: Secondary | ICD-10-CM | POA: Insufficient documentation

## 2019-06-16 DIAGNOSIS — Y9389 Activity, other specified: Secondary | ICD-10-CM | POA: Diagnosis not present

## 2019-06-16 DIAGNOSIS — W19XXXA Unspecified fall, initial encounter: Secondary | ICD-10-CM | POA: Diagnosis not present

## 2019-06-16 DIAGNOSIS — T50901A Poisoning by unspecified drugs, medicaments and biological substances, accidental (unintentional), initial encounter: Secondary | ICD-10-CM | POA: Insufficient documentation

## 2019-06-16 DIAGNOSIS — Y999 Unspecified external cause status: Secondary | ICD-10-CM | POA: Diagnosis not present

## 2019-06-16 DIAGNOSIS — S0101XA Laceration without foreign body of scalp, initial encounter: Secondary | ICD-10-CM | POA: Insufficient documentation

## 2019-06-16 DIAGNOSIS — Y929 Unspecified place or not applicable: Secondary | ICD-10-CM | POA: Diagnosis not present

## 2019-06-16 DIAGNOSIS — J45909 Unspecified asthma, uncomplicated: Secondary | ICD-10-CM | POA: Diagnosis not present

## 2019-06-16 NOTE — ED Notes (Signed)
Patient transported to CT 

## 2019-06-16 NOTE — ED Triage Notes (Signed)
Pt was out drinking and took one xanax, he passed out and fell to his left side and back of head, it appears he has a laceration to the back of his head and states that his left knee hurts and left arm Pt denies SI, he was out having fun

## 2019-06-16 NOTE — ED Provider Notes (Addendum)
[facility] DEPT Provider Note   CSN: [id number] Arrival date & time: 06/16/19  2149     History   Chief Complaint No chief complaint [name] file.   HPI Drew [name] is a 23 y.o. male.     The history is provided by the patient and a relative.  Fall This is a new problem. The current episode started less than 1 hour ago. The problem occurs constantly. The problem has not changed since onset.Pertinent negatives include no chest pain, no abdominal pain, no headaches and no shortness of breath. Nothing aggravates the symptoms. Nothing relieves the symptoms. The treatment provided no relief.  Was doing drugs and alcohol to get high and fell, striking his head.  No SI or HI no AH or VH. Has laceration to the scalp.    Past Medical History:  Diagnosis Date  . Abscess   . Asthma     There are no active problems to display for this patient.   Past Surgical History:  Procedure Laterality Date  . FRACTURE SURGERY          Home Medications    Prior to Admission medications   Medication Sig Start Date End Date Taking? Authorizing Provider  predniSONE (DELTASONE) 20 MG tablet Take 2 tablets (40 mg total) by mouth daily. Patient not taking: Reported [name] 06/16/2019 05/28/19   Carlisle Cater, PA-C    Family History Family History  Adopted: Yes    Social History Social History   Tobacco Use  . Smoking status: Light Tobacco Smoker    Types: Cigarettes  . Smokeless tobacco: Never Used  Substance Use Topics  . Alcohol use: Not Currently  . Drug use: Yes    Types: Marijuana     Allergies   Dairy aid [lactase], Silver, and Chocolate   Review of Systems Review of Systems  Constitutional: Negative for unexpected weight change.  HENT: Negative for congestion.   Eyes: Negative for visual disturbance.  Respiratory: Negative for shortness of breath.   Cardiovascular: Negative for chest pain.  Gastrointestinal: Negative for abdominal  pain.  Genitourinary: Negative for difficulty urinating.  Musculoskeletal: Negative for arthralgias.  Skin: Positive for wound.  Neurological: Negative for headaches.  Psychiatric/Behavioral: Negative for agitation.  All other systems reviewed and are negative.    Physical Exam Updated Vital Signs BP 126/74   Pulse 99   Temp 98.3 F (36.8 C) (Oral)   Resp 18   SpO2 98%   Physical Exam Vitals signs and nursing note reviewed.  Constitutional:      General: He is not in acute distress.    Appearance: He is normal weight.  HENT:     Head: Normocephalic.     Nose: Nose normal.  Eyes:     Conjunctiva/sclera: Conjunctivae normal.     Pupils: Pupils are equal, round, and reactive to light.  Neck:     Musculoskeletal: Normal range of motion and neck supple.  Cardiovascular:     Rate and Rhythm: Normal rate and regular rhythm.     Pulses: Normal pulses.     Heart sounds: Normal heart sounds.  Pulmonary:     Effort: Pulmonary effort is normal.     Breath sounds: Normal breath sounds.  Abdominal:     General: Abdomen is flat. Bowel sounds are normal.     Tenderness: There is no abdominal tenderness. There is no guarding.  Musculoskeletal: Normal range of motion.  Skin:    General: Skin is warm and dry.  Capillary Refill: Capillary refill takes less than 2 seconds.  Neurological:     General: No focal deficit present.     Mental Status: He is alert and oriented to person, place, and time.     Deep Tendon Reflexes: Reflexes normal.  Psychiatric:        Mood and Affect: Mood normal.        Behavior: Behavior normal.      ED Treatments / Results  Labs (all labs ordered are listed, but only abnormal results are displayed) Labs Reviewed - No data to display  EKG None  Radiology Ct Head Wo Contrast  Result Date: 06/16/2019 CLINICAL DATA:  Fall hitting back of head EXAM: CT HEAD WITHOUT CONTRAST TECHNIQUE: Contiguous axial images were obtained from the base of the  skull through the vertex without intravenous contrast. COMPARISON:  None. FINDINGS: Brain: No evidence of acute territorial infarction, hemorrhage, hydrocephalus,extra-axial collection or mass lesion/mass effect. Normal gray-white differentiation. Ventricles are normal in size and contour. Vascular: No hyperdense vessel or unexpected calcification. Skull: The skull is intact. No fracture or focal lesion identified. Sinuses/Orbits: The visualized paranasal sinuses and mastoid air cells are clear. The orbits and globes intact. Other: None Cervical spine: Alignment: Physiologic Skull base and vertebrae: Visualized skull base is intact. No atlanto-occipital dissociation. The vertebral body heights are well maintained. No fracture or pathologic osseous lesion seen. Soft tissues and spinal canal: The visualized paraspinal soft tissues are unremarkable. No prevertebral soft tissue swelling is seen. The spinal canal is grossly unremarkable, no large epidural collection or significant canal narrowing. Disc levels:  No significant canal or neural foraminal narrowing. Upper chest: The lung apices are clear. Thoracic inlet is within normal limits. Other: None IMPRESSION: No acute intracranial abnormality. No acute fracture or malalignment of the spine. Electronically Signed   By: Jonna Clark M.D.   [name]: 06/16/2019 23:48   Ct Cervical Spine Wo Contrast  Result Date: 06/16/2019 CLINICAL DATA:  Fall hitting back of head EXAM: CT HEAD WITHOUT CONTRAST TECHNIQUE: Contiguous axial images were obtained from the base of the skull through the vertex without intravenous contrast. COMPARISON:  None. FINDINGS: Brain: No evidence of acute territorial infarction, hemorrhage, hydrocephalus,extra-axial collection or mass lesion/mass effect. Normal gray-white differentiation. Ventricles are normal in size and contour. Vascular: No hyperdense vessel or unexpected calcification. Skull: The skull is intact. No fracture or focal lesion  identified. Sinuses/Orbits: The visualized paranasal sinuses and mastoid air cells are clear. The orbits and globes intact. Other: None Cervical spine: Alignment: Physiologic Skull base and vertebrae: Visualized skull base is intact. No atlanto-occipital dissociation. The vertebral body heights are well maintained. No fracture or pathologic osseous lesion seen. Soft tissues and spinal canal: The visualized paraspinal soft tissues are unremarkable. No prevertebral soft tissue swelling is seen. The spinal canal is grossly unremarkable, no large epidural collection or significant canal narrowing. Disc levels:  No significant canal or neural foraminal narrowing. Upper chest: The lung apices are clear. Thoracic inlet is within normal limits. Other: None IMPRESSION: No acute intracranial abnormality. No acute fracture or malalignment of the spine. Electronically Signed   By: Jonna Clark M.D.   [name]: 06/16/2019 23:48    Procedures .Marland KitchenLaceration Repair  Date/Time: 06/17/2019 12:19 AM Performed by: Cy Blamer, MD Authorized by: Cy Blamer, MD   Consent:    Consent obtained:  Verbal   Risks discussed:  Infection, need for additional repair, nerve damage, pain, poor cosmetic result and poor wound healing   Alternatives discussed:  No treatment Anesthesia (see MAR for exact dosages):    Anesthesia method:  None Laceration details:    Location:  Scalp   Scalp location:  Occipital   Length (cm):  1.3   Depth (mm):  1 Repair type:    Repair type:  Simple Pre-procedure details:    Preparation:  Patient was prepped and draped in usual sterile fashion Exploration:    Hemostasis achieved with:  Direct pressure   Wound exploration: wound explored through full range of motion     Wound extent: no areolar tissue violation noted   Treatment:    Area cleansed with:  Betadine   Amount of cleaning:  Extensive Skin repair:    Repair method:  Staples   Number of staples:  3 Approximation:     Approximation:  Close Post-procedure details:    Dressing:  Open (no dressing)   Patient tolerance of procedure:  Tolerated well, no immediate complications   (including critical care time)  Medications Ordered in ED Medications - No data to display  Staple removal in 5 days at urgent care.  Don't use drugs and drink alcohol.  Awake and PO challenged successfully in the ED.  Was not trying to hurt himself.  Is stable for discharge.   Drew [name] was evaluated in Emergency Department [name] 06/17/2019 for the symptoms described in the history of present illness. He was evaluated in the context of the global COVID-19 pandemic, which necessitated consideration that the patient might be at risk for infection with the SARS-CoV-2 virus that causes COVID-19. Institutional protocols and algorithms that pertain to the evaluation of patients at risk for COVID-19 are in a state of rapid change based [name] information released by regulatory bodies including the CDC and federal and state organizations. These policies and algorithms were followed during the patient's care in the ED. Final Clinical Impressions(s) / ED Diagnoses    Return for weakness, numbness, changes in vision or speech, fevers >100.4 unrelieved by medication, shortness of breath, intractable vomiting, or diarrhea, abdominal pain, Inability to tolerate liquids or food, cough, altered mental status or any concerns. No signs of systemic illness or infection. The patient is nontoxic-appearing [name] exam and vital signs are within normal limits.   I have reviewed the triage vital signs and the nursing notes. Pertinent labs &imaging results that were available during my care of the patient were reviewed by me and considered in my medical decision making (see chart for details).  After history, exam, and medical workup I feel the patient has been appropriately medically screened and is safe for discharge home. Pertinent diagnoses were  discussed with the patient. Patient was given return precautions   Ben Sanz, MD 06/17/19 [address] [id number]    Nilam Quakenbush, MD 06/17/19 [id number]

## 2019-06-16 NOTE — ED Notes (Signed)
Patient has returned from CT and stapler at bedside.

## 2019-06-17 ENCOUNTER — Other Ambulatory Visit: Payer: Self-pay

## 2019-06-17 ENCOUNTER — Emergency Department (HOSPITAL_COMMUNITY)
Admission: EM | Admit: 2019-06-17 | Discharge: 2019-06-17 | Disposition: A | Discharge status: Home / Self Care | Payer: 59 | Source: Home / Self Care | Attending: Emergency Medicine | Admitting: Emergency Medicine

## 2019-06-17 ENCOUNTER — Encounter (HOSPITAL_COMMUNITY): Payer: Self-pay | Admitting: Emergency Medicine

## 2019-06-17 ENCOUNTER — Emergency Department (HOSPITAL_COMMUNITY): Payer: 59

## 2019-06-17 DIAGNOSIS — M25572 Pain in left ankle and joints of left foot: Secondary | ICD-10-CM

## 2019-06-17 DIAGNOSIS — S9032XA Contusion of left foot, initial encounter: Secondary | ICD-10-CM

## 2019-06-17 DIAGNOSIS — S5002XA Contusion of left elbow, initial encounter: Secondary | ICD-10-CM

## 2019-06-17 DIAGNOSIS — R52 Pain, unspecified: Secondary | ICD-10-CM

## 2019-06-17 MED ORDER — NALOXONE HCL 2 MG/2ML IJ SOSY
2.0000 mg | PREFILLED_SYRINGE | Freq: Once | INTRAMUSCULAR | Status: AC
  Administered 2019-06-17: 2 mg via INTRAMUSCULAR
  Filled 2019-06-17: qty 2

## 2019-06-17 MED ORDER — IBUPROFEN 800 MG PO TABS
800.0000 mg | ORAL_TABLET | Freq: Once | ORAL | Status: AC
  Administered 2019-06-17: 02:00:00 800 mg via ORAL
  Filled 2019-06-17: qty 1

## 2019-06-17 NOTE — ED Notes (Signed)
Assisted Dr. Curlene Labrum with placing three staples in his posterior head for a laceration. Patient is obtundent, and sister is at bedside.

## 2019-06-17 NOTE — ED Notes (Signed)
Crutches and cam walker boot applied, patient educated on use. Patient declined updated vital signs for DC, and declined to sign the signature pad.

## 2019-06-17 NOTE — ED Triage Notes (Signed)
Yesterday patient fell on his left side and was seen here yesterday. Patient comes in today complaining of left elbow pain and left ankle pain.

## 2019-06-17 NOTE — ED Provider Notes (Signed)
[facility]-EMERGENCY DEPT Provider Note   CSN: [id number] Arrival date & time: 06/17/19  1953     History   Chief Complaint Chief Complaint  Patient presents with   Ankle Injury   Elbow Injury    HPI Drew [name] is a 23 y.o. male.     Patient was seen last night after a fall. He was treated for head laceration. Today he noted that he was having pain in his left elbow and left ankle/lateral foot. He states he is unable to bear weight on his foot. He has an abrasion to his left elbow and minimal soft tissue swelling.  The history is provided by the patient and medical records. No language interpreter was used.  Ankle Injury This is a new problem. The current episode started yesterday. The problem has been gradually worsening.  Arm Injury Location:  Elbow Elbow location:  L elbow Pain details:    Quality:  Throbbing   Severity:  Moderate   Onset quality:  Sudden   Duration:  1 day   Timing:  Intermittent   Progression:  Waxing and waning Dislocation: no   Foreign body present:  No foreign bodies Prior injury to area:  No Worsened by:  Movement Associated symptoms: no numbness and no tingling     Past Medical History:  Diagnosis Date   Abscess    Asthma     There are no active problems to display for this patient.   Past Surgical History:  Procedure Laterality Date   FRACTURE SURGERY          Home Medications    Prior to Admission medications   Medication Sig Start Date End Date Taking? Authorizing Provider  predniSONE (DELTASONE) 20 MG tablet Take 2 tablets (40 mg total) by mouth daily. Patient not taking: Reported on 06/16/2019 05/28/19   Renne Crigler, PA-C    Family History Family History  Adopted: Yes    Social History Social History   Tobacco Use   Smoking status: Light Tobacco Smoker    Types: Cigarettes   Smokeless tobacco: Never Used  Substance Use Topics   Alcohol use: Not Currently   Drug  use: Yes    Types: Marijuana     Allergies   Dairy aid [lactase], Silver, and Chocolate   Review of Systems Review of Systems  Musculoskeletal: Positive for arthralgias.  Skin: Positive for wound.  All other systems reviewed and are negative.    Physical Exam Updated Vital Signs BP (!) 145/86 (BP Location: Left Arm)    Pulse 88    Temp 99.8 F (37.7 C) (Oral)    SpO2 99%   Physical Exam Vitals signs and nursing note reviewed.  HENT:     Head: Normocephalic.  Eyes:     Conjunctiva/sclera: Conjunctivae normal.  Neck:     Musculoskeletal: Normal range of motion and neck supple.  Cardiovascular:     Rate and Rhythm: Normal rate and regular rhythm.  Pulmonary:     Effort: Pulmonary effort is normal.     Breath sounds: Normal breath sounds.  Abdominal:     Palpations: Abdomen is soft.  Musculoskeletal:        General: Tenderness present. No deformity.     Left elbow: He exhibits no effusion. Tenderness found.     Left foot: Tenderness present.       Feet:     Comments: Tenderness mid-shaft fifth metatarsal with swelling. Lateral malleolar tenderness without swelling.  Skin:  General: Skin is warm and dry.  Neurological:     Mental Status: He is alert and oriented to person, place, and time.  Psychiatric:        Mood and Affect: Mood normal.      ED Treatments / Results  Labs (all labs ordered are listed, but only abnormal results are displayed) Labs Reviewed - No data to display  EKG None  Radiology Dg Elbow Complete Left  Result Date: 06/17/2019 CLINICAL DATA:  Injury. Left elbow pain after fall on left side. EXAM: LEFT ELBOW - COMPLETE 3+ VIEW COMPARISON:  None. FINDINGS: There is no evidence of fracture, dislocation, or joint effusion. There is no evidence of arthropathy or other focal bone abnormality. Mild soft tissue irregularity/edema posteriorly. IMPRESSION: Mild soft tissue irregularity/edema posteriorly. No osseous abnormality. No joint  effusion. Electronically Signed   By: Keith Rake M.D.   On: 06/17/2019 20:28   Dg Ankle Complete Left  Result Date: 06/17/2019 CLINICAL DATA:  Injury.  Fall on left side with left ankle pain. EXAM: LEFT ANKLE COMPLETE - 3+ VIEW COMPARISON:  None. FINDINGS: There is no evidence of fracture, dislocation, or joint effusion. There is no evidence of arthropathy or other focal bone abnormality. Soft tissues are unremarkable. IMPRESSION: Negative radiographs of the left ankle. Electronically Signed   By: Keith Rake M.D.   On: 06/17/2019 20:29   Ct Head Wo Contrast  Result Date: 06/16/2019 CLINICAL DATA:  Fall hitting back of head EXAM: CT HEAD WITHOUT CONTRAST TECHNIQUE: Contiguous axial images were obtained from the base of the skull through the vertex without intravenous contrast. COMPARISON:  None. FINDINGS: Brain: No evidence of acute territorial infarction, hemorrhage, hydrocephalus,extra-axial collection or mass lesion/mass effect. Normal gray-white differentiation. Ventricles are normal in size and contour. Vascular: No hyperdense vessel or unexpected calcification. Skull: The skull is intact. No fracture or focal lesion identified. Sinuses/Orbits: The visualized paranasal sinuses and mastoid air cells are clear. The orbits and globes intact. Other: None Cervical spine: Alignment: Physiologic Skull base and vertebrae: Visualized skull base is intact. No atlanto-occipital dissociation. The vertebral body heights are well maintained. No fracture or pathologic osseous lesion seen. Soft tissues and spinal canal: The visualized paraspinal soft tissues are unremarkable. No prevertebral soft tissue swelling is seen. The spinal canal is grossly unremarkable, no large epidural collection or significant canal narrowing. Disc levels:  No significant canal or neural foraminal narrowing. Upper chest: The lung apices are clear. Thoracic inlet is within normal limits. Other: None IMPRESSION: No acute  intracranial abnormality. No acute fracture or malalignment of the spine. Electronically Signed   By: Prudencio Pair M.D.   On: 06/16/2019 23:48   Ct Cervical Spine Wo Contrast  Result Date: 06/16/2019 CLINICAL DATA:  Fall hitting back of head EXAM: CT HEAD WITHOUT CONTRAST TECHNIQUE: Contiguous axial images were obtained from the base of the skull through the vertex without intravenous contrast. COMPARISON:  None. FINDINGS: Brain: No evidence of acute territorial infarction, hemorrhage, hydrocephalus,extra-axial collection or mass lesion/mass effect. Normal gray-white differentiation. Ventricles are normal in size and contour. Vascular: No hyperdense vessel or unexpected calcification. Skull: The skull is intact. No fracture or focal lesion identified. Sinuses/Orbits: The visualized paranasal sinuses and mastoid air cells are clear. The orbits and globes intact. Other: None Cervical spine: Alignment: Physiologic Skull base and vertebrae: Visualized skull base is intact. No atlanto-occipital dissociation. The vertebral body heights are well maintained. No fracture or pathologic osseous lesion seen. Soft tissues and spinal canal: The visualized  paraspinal soft tissues are unremarkable. No prevertebral soft tissue swelling is seen. The spinal canal is grossly unremarkable, no large epidural collection or significant canal narrowing. Disc levels:  No significant canal or neural foraminal narrowing. Upper chest: The lung apices are clear. Thoracic inlet is within normal limits. Other: None IMPRESSION: No acute intracranial abnormality. No acute fracture or malalignment of the spine. Electronically Signed   By: Bindu  Avutu M.D.   On: 06/16/2019 23:48   Dg Foot ComplJonna Clarkete Left  Result Date: 06/17/2019 CLINICAL DATA:  Left foot pain, mid fifth metatarsal, after fall. Ankle radiograph earlier this day. EXAM: LEFT FOOT - COMPLETE 3+ VIEW COMPARISON:  None. FINDINGS: There is no evidence of fracture or dislocation.  Particularly, the fifth metatarsal is intact. There is no evidence of arthropathy or other focal bone abnormality. Soft tissues are unremarkable. IMPRESSION: Negative radiographs of the left foot. Electronically Signed   By: Narda RutherfordMelanie  Sanford M.D.   On: 06/17/2019 23:25    Procedures Procedures (including critical care time)  Medications Ordered in ED Medications - No data to display   Initial Impression / Assessment and Plan / ED Course  I have reviewed the triage vital signs and the nursing notes.  Pertinent labs & imaging results that were available during my care of the patient were reviewed by me and considered in my medical decision making (see chart for details).        Patient X-Ray negative for obvious fracture or dislocation.  Pt advised to follow up with orthopedics. Patient given cam walker, crutches while in ED, conservative therapy recommended and discussed. Patient will be discharged home & is agreeable with above plan. Returns precautions discussed. Pt appears safe for discharge.  Final Clinical Impressions(s) / ED Diagnoses   Final diagnoses:  Contusion of left elbow, initial encounter  Contusion of left foot, initial encounter  Acute left ankle pain    ED Discharge Orders    None       Felicie MornSmith, Imberly Troxler, NP 06/17/19 2347    Charlynne PanderYao, Sherlene Rickel Hsienta, MD 06/22/19 [phone]

## 2019-06-17 NOTE — ED Notes (Signed)
Patients is more alert, but slurring his words.

## 2019-06-18 NOTE — ED Notes (Signed)
Patient giggled to self and said, "Hmmm all you girls lookin' so damn cute."

## 2019-09-25 ENCOUNTER — Other Ambulatory Visit: Payer: Self-pay

## 2019-09-25 ENCOUNTER — Emergency Department (HOSPITAL_COMMUNITY)
Admission: EM | Admit: 2019-09-25 | Discharge: 2019-09-26 | Disposition: A | Discharge status: Home / Self Care | Payer: 59 | Attending: Emergency Medicine | Admitting: Emergency Medicine

## 2019-09-25 ENCOUNTER — Encounter (HOSPITAL_COMMUNITY): Payer: Self-pay

## 2019-09-25 DIAGNOSIS — R109 Unspecified abdominal pain: Secondary | ICD-10-CM | POA: Diagnosis present

## 2019-09-25 DIAGNOSIS — F17210 Nicotine dependence, cigarettes, uncomplicated: Secondary | ICD-10-CM | POA: Diagnosis not present

## 2019-09-25 DIAGNOSIS — L0501 Pilonidal cyst with abscess: Secondary | ICD-10-CM | POA: Diagnosis not present

## 2019-09-25 DIAGNOSIS — J45909 Unspecified asthma, uncomplicated: Secondary | ICD-10-CM | POA: Diagnosis not present

## 2019-09-25 DIAGNOSIS — R0602 Shortness of breath: Secondary | ICD-10-CM | POA: Diagnosis not present

## 2019-09-25 LAB — CBC
HCT: 46.8 % (ref 39.0–52.0)
Hemoglobin: 16.4 g/dL (ref 13.0–17.0)
MCH: 29 pg (ref 26.0–34.0)
MCHC: 35 g/dL (ref 30.0–36.0)
MCV: 82.7 fL (ref 80.0–100.0)
Platelets: 255 10*3/uL (ref 150–400)
RBC: 5.66 MIL/uL (ref 4.22–5.81)
RDW: 14.5 % (ref 11.5–15.5)
WBC: 9 10*3/uL (ref 4.0–10.5)
nRBC: 0 % (ref 0.0–0.2)

## 2019-09-25 MED ORDER — SODIUM CHLORIDE 0.9% FLUSH
3.0000 mL | Freq: Once | INTRAVENOUS | Status: AC
  Administered 2019-09-25: 3 mL via INTRAVENOUS

## 2019-09-25 NOTE — ED Triage Notes (Signed)
Pt reports abdominal pain that is tender to touch. States that it started 2 weeks ago, but got much worse today. He also reports some intermittent SOB and an abscess on his back that he would like drained. A&Ox4. Ambulatory.

## 2019-09-26 ENCOUNTER — Encounter (HOSPITAL_COMMUNITY): Payer: Self-pay

## 2019-09-26 ENCOUNTER — Emergency Department (HOSPITAL_COMMUNITY): Payer: 59

## 2019-09-26 LAB — URINALYSIS, ROUTINE W REFLEX MICROSCOPIC
Bilirubin Urine: NEGATIVE
Glucose, UA: NEGATIVE mg/dL
Hgb urine dipstick: NEGATIVE
Ketones, ur: NEGATIVE mg/dL
Leukocytes,Ua: NEGATIVE
Nitrite: NEGATIVE
Protein, ur: NEGATIVE mg/dL
Specific Gravity, Urine: 1.012 (ref 1.005–1.030)
pH: 6 (ref 5.0–8.0)

## 2019-09-26 LAB — COMPREHENSIVE METABOLIC PANEL
ALT: 8 U/L (ref 0–44)
AST: 14 U/L — ABNORMAL LOW (ref 15–41)
Albumin: 3.9 g/dL (ref 3.5–5.0)
Alkaline Phosphatase: 56 U/L (ref 38–126)
Anion gap: 8 (ref 5–15)
BUN: 7 mg/dL (ref 6–20)
CO2: 28 mmol/L (ref 22–32)
Calcium: 9.1 mg/dL (ref 8.9–10.3)
Chloride: 103 mmol/L (ref 98–111)
Creatinine, Ser: 0.89 mg/dL (ref 0.61–1.24)
GFR calc Af Amer: >60 mL/min (ref >60)
GFR calc non Af Amer: >60 mL/min (ref >60)
Glucose, Bld: 90 mg/dL (ref 70–99)
Potassium: 3.7 mmol/L (ref 3.5–5.1)
Sodium: 139 mmol/L (ref 135–145)
Total Bilirubin: 0.7 mg/dL (ref 0.3–1.2)
Total Protein: 6.7 g/dL (ref 6.5–8.1)

## 2019-09-26 LAB — LIPASE, BLOOD: Lipase: 26 U/L (ref 11–51)

## 2019-09-26 MED ORDER — NAPROXEN 500 MG PO TABS
500.0000 mg | ORAL_TABLET | Freq: Once | ORAL | Status: AC
  Administered 2019-09-26: 500 mg via ORAL
  Filled 2019-09-26: qty 1

## 2019-09-26 MED ORDER — CLINDAMYCIN HCL 300 MG PO CAPS
300.0000 mg | ORAL_CAPSULE | Freq: Once | ORAL | Status: AC
  Administered 2019-09-26: 300 mg via ORAL
  Filled 2019-09-26: qty 1

## 2019-09-26 MED ORDER — IBUPROFEN 600 MG PO TABS: 600.0000 mg | ORAL_TABLET | Freq: Four times a day (QID) | ORAL | 0 refills | Status: DC | PRN

## 2019-09-26 MED ORDER — CLINDAMYCIN HCL 300 MG PO CAPS: 300.0000 mg | ORAL_CAPSULE | Freq: Three times a day (TID) | ORAL | 0 refills | Status: DC

## 2019-09-26 MED ORDER — LIDOCAINE-EPINEPHRINE (PF) 2 %-1:200000 IJ SOLN
20.0000 mL | Freq: Once | INTRAMUSCULAR | Status: AC
  Administered 2019-09-26: 20 mL via INTRADERMAL
  Filled 2019-09-26: qty 20

## 2019-09-26 MED ORDER — IOHEXOL 300 MG/ML  SOLN
100.0000 mL | Freq: Once | INTRAMUSCULAR | Status: AC | PRN
  Administered 2019-09-26: 100 mL via INTRAVENOUS

## 2019-09-26 NOTE — ED Notes (Signed)
Patient was verbalized discharge instructions. Pt had no further questions at this time. NAD. 

## 2019-09-26 NOTE — ED Notes (Signed)
I&D tray and lidocaine at bedside 

## 2019-09-26 NOTE — ED Provider Notes (Signed)
[facility]-EMERGENCY DEPT Provider Note   CSN: [id number] Arrival date & time: 09/25/19  2245     History Chief Complaint  Patient presents with  . Abdominal Pain    Drew [name] is a 25 y.o. male.  HPI     24 year old comes in a chief complaint of shortness of breath, abdominal pain.  Patient has history of abscess and asthma.  He reports that his been feeling short of breath for the last week or so.  Intermittently he will get short of breath without any associated cough, chest pain.  Additionally he is also having abdominal pain.  The abdominal pain is also has been present for 2 weeks.  The pain is constant and right-sided, but it got acutely worse today without any provocation.  The pain is described as sharp pain.  There is no history of kidney stone or appendicitis.  He denies any associated fevers, chills.  Patient is also complaining of abscess in his gluteal region.  He has history of pilonidal cyst.  That pain has been present for several days as well.  Past Medical History:  Diagnosis Date  . Abscess   . Asthma     There are no problems to display for this patient.   Past Surgical History:  Procedure Laterality Date  . FRACTURE SURGERY         Family History  Adopted: Yes    Social History   Tobacco Use  . Smoking status: Light Tobacco Smoker    Types: Cigarettes  . Smokeless tobacco: Never Used  Substance Use Topics  . Alcohol use: Not Currently  . Drug use: Yes    Types: Marijuana    Home Medications Prior to Admission medications   Medication Sig Start Date End Date Taking? Authorizing Provider  clindamycin (CLEOCIN) 300 MG capsule Take 1 capsule (300 mg total) by mouth 3 (three) times daily. 09/26/19   Derwood Kaplan, MD  ibuprofen (ADVIL) 600 MG tablet Take 1 tablet (600 mg total) by mouth every 6 (six) hours as needed. 09/26/19   Derwood Kaplan, MD  predniSONE (DELTASONE) 20 MG tablet Take 2 tablets (40 mg  total) by mouth daily. Patient not taking: Reported on 06/16/2019 05/28/19   Renne Crigler, PA-C    Allergies    Dairy aid [lactase], Silver, and Chocolate  Review of Systems   Review of Systems  Constitutional: Positive for activity change.  Respiratory: Positive for shortness of breath. Negative for cough and wheezing.   Gastrointestinal: Positive for abdominal pain.  Hematological: Does not bruise/bleed easily.  All other systems reviewed and are negative.   Physical Exam Updated Vital Signs BP 105/65 (BP Location: Right Arm)   Pulse 68   Temp 99.2 F (37.3 C) (Oral)   Resp 15   Ht 5\' 6"  (1.676 m)   Wt 74.8 kg   SpO2 96%   BMI 26.63 kg/m   Physical Exam Vitals and nursing note reviewed.  Constitutional:      Appearance: He is well-developed.  HENT:     Head: Atraumatic.  Cardiovascular:     Rate and Rhythm: Normal rate.  Pulmonary:     Effort: Pulmonary effort is normal.  Abdominal:     Tenderness: There is abdominal tenderness in the right upper quadrant and right lower quadrant. There is guarding. There is no rebound. Negative signs include Murphy's sign and McBurney's sign.  Musculoskeletal:     Cervical back: Neck supple.  Skin:    General: Skin  is warm.     Comments: Erythematous and tender lesion over the gluteal cleft  Neurological:     Mental Status: He is alert and oriented to person, place, and time.     ED Results / Procedures / Treatments   Labs (all labs ordered are listed, but only abnormal results are displayed) Labs Reviewed  COMPREHENSIVE METABOLIC PANEL - Abnormal; Notable for the following components:      Result Value   AST 14 (*)    All other components within normal limits  LIPASE, BLOOD  CBC  URINALYSIS, ROUTINE W REFLEX MICROSCOPIC    EKG None  Radiology CT ABDOMEN PELVIS W CONTRAST  Result Date: 09/26/2019 CLINICAL DATA:  Right lower quadrant abdominal pain, tender to touch, began 2 weeks prior, acutely worsening today.  Also reported posterior abscess EXAM: CT ABDOMEN AND PELVIS WITH CONTRAST TECHNIQUE: Multidetector CT imaging of the abdomen and pelvis was performed using the standard protocol following bolus administration of intravenous contrast. CONTRAST:  OMNIPAQUE IOHEXOL 300 MG/ML  SOLN COMPARISON:  CT abdomen pelvis 05/15/2019 FINDINGS: Lower chest: Normal cardiac size. 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 No pericardial effusion. Lung bases are clear. Hepatobiliary: No focal liver abnormality is seen. No gallstones, gallbladder wall thickening, or biliary dilatation. Pancreas: Unremarkable. No pancreatic ductal dilatation or surrounding inflammatory changes. Spleen: Normal in size without focal abnormality. Adrenals/Urinary Tract: Adrenal glands are unremarkable. Kidneys are normal, without renal calculi, focal lesion, or hydronephrosis. Mild bladder wall thickening, possibly related to underdistention though question some faint hazy vesicular stranding. Stomach/Bowel: Distal esophagus, stomach and duodenal sweep are unremarkable. No small bowel wall thickening or dilatation. No evidence of obstruction. Normal air-filled appendix in the right lower quadrant. Vascular/Lymphatic: No significant vascular findings are present. No enlarged abdominal or pelvic lymph nodes. Reproductive: The prostate and seminal vesicles are unremarkable. Other: Small subcutaneous rim enhancing fluid collection in the posterior soft tissues superficial to the sacrum just superior to the gluteal cleft measuring 15 x 13 x 16 mm in size (2/71) with surrounding stranding and overlying skin thickening. A second smaller region of skin thickening and possible subcutaneous collection is seen along the right gluteal cleft measuring approximately 9 x 5 x 10 mm (2/87). Some irregular skin thickening is noted in the mons pubis as well. No soft tissue gas. No abdominopelvic free fluid or air. No  bowel containing hernias. Musculoskeletal: No acute osseous abnormality or suspicious osseous lesion. IMPRESSION: 1. Tiny rim enhancing collections along the posterior midline soft tissues superficial to the sacrum and along the right gluteal cleft. Recommend correlation with direct visualization to assess for possible pilonidal cyst or abscess. 2. Some irregular skin thickening is noted in the mons pubis correlat. Correlate with physical exam findings to assess for cellulitis. No soft tissue gas. 3. Mild bladder wall thickening, possibly related to underdistention though question some faint hazy vesicular stranding. Correlate with urinalysis to exclude cystitis. 4. 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 Electronically Signed   By: Kreg Shropshire M.D.   On: 09/26/2019 03:13    Procedures .Marland KitchenIncision and Drainage  Date/Time: 09/26/2019 3:54 AM Performed by: Derwood Kaplan, MD Authorized by: Derwood Kaplan, MD   Consent:    Consent obtained:  Verbal   Consent given by:  Patient   Risks discussed:  Bleeding, incomplete drainage, pain and infection   Alternatives discussed:  No treatment Location:    Type:  Pilonidal cyst  Size:  3   Location:  Anogenital   Anogenital location:  Gluteal cleft Pre-procedure details:    Skin preparation:  Antiseptic wash and Chloraprep Anesthesia (see MAR for exact dosages):    Anesthesia method:  Local infiltration   Local anesthetic:  Lidocaine 2% WITH epi Procedure type:    Complexity:  Simple Procedure details:    Incision types:  Stab incision and single straight   Incision depth:  Subcutaneous   Scalpel blade:  10   Wound management:  Probed and deloculated and irrigated with saline   Drainage:  Serosanguinous and purulent   Packing materials:  1/4 in gauze Post-procedure details:    Patient tolerance of procedure:  Tolerated well, no immediate complications   (including critical care time)  Medications Ordered in ED Medications    sodium chloride flush (NS) 0.9 % injection 3 mL (3 mLs Intravenous Given 09/25/19 2342)  lidocaine-EPINEPHrine (XYLOCAINE W/EPI) 2 %-1:200000 (PF) injection 20 mL (20 mLs Intradermal Given by Other 09/26/19 0230)  iohexol (OMNIPAQUE) 300 MG/ML solution 100 mL (100 mLs Intravenous Contrast Given 09/26/19 0231)  clindamycin (CLEOCIN) capsule 300 mg (300 mg Oral Given 09/26/19 0354)  naproxen (NAPROSYN) tablet 500 mg (500 mg Oral Given 09/26/19 0354)    ED Course  I have reviewed the triage vital signs and the nursing notes.  Pertinent labs & imaging results that were available during my care of the patient were reviewed by me and considered in my medical decision making (see chart for details).    MDM Rules/Calculators/A&P                      24 year old comes in a chief complaint of abdominal pain and back pain. His abdominal pain has been present for the last several days.  The pain has intensified over the last couple of days and he decided to come to the ER.  His entire right side of the abdomen is significantly tender.  He has no UTI-like symptoms and no history of kidney stones.  Pain does not appear to be related to p.o. intake.  Cholelithiasis and appendicitis are in the differential.  CT scan will be ordered given the significant discomfort he is having with palpation of his abdomen.  Additionally he is also complaining of back pain that is the result of pilonidal cyst infection.  I&D was completed with minimal purulent drainage.  We will put him on antibiotics and have him follow-up with surgery.  Finally patient did mention some shortness of breath and headaches.  He does not want to be tested for COVID-19.  He has no wheezing on my exam.  Final Clinical Impression(s) / ED Diagnoses Final diagnoses:  Pilonidal cyst with abscess    Rx / DC Orders ED Discharge Orders         Ordered    clindamycin (CLEOCIN) 300 MG capsule  3 times daily     09/26/19 0350    ibuprofen (ADVIL) 600  MG tablet  Every 6 hours PRN     09/26/19 0351           Varney Biles, MD 09/26/19 [phone]

## 2019-09-26 NOTE — Discharge Instructions (Signed)
We saw in the ER for abdominal pain and back pain. You had a pilonidal cyst that has been drained.. CT scan of your abdomen did not reveal any evidence of appendicitis, gallbladder problems, or kidney disease. Please take the antibiotics prescribed.  Return to the ER in 3 days for wound recheck. Follow-up with [facility] to get the cyst removed.

## 2019-09-26 NOTE — ED Notes (Signed)
Pt. Made aware for the need of urine specimen. 

## 2019-11-22 ENCOUNTER — Other Ambulatory Visit: Payer: Self-pay

## 2019-11-22 ENCOUNTER — Emergency Department (HOSPITAL_COMMUNITY): Payer: Self-pay

## 2019-11-22 ENCOUNTER — Encounter (HOSPITAL_COMMUNITY): Payer: Self-pay | Admitting: *Deleted

## 2019-11-22 ENCOUNTER — Emergency Department (HOSPITAL_COMMUNITY)
Admission: EM | Admit: 2019-11-22 | Discharge: 2019-11-22 | Disposition: A | Discharge status: Home / Self Care | Payer: Self-pay | Attending: Emergency Medicine | Admitting: Emergency Medicine

## 2019-11-22 DIAGNOSIS — K610 Anal abscess: Secondary | ICD-10-CM | POA: Insufficient documentation

## 2019-11-22 DIAGNOSIS — F17210 Nicotine dependence, cigarettes, uncomplicated: Secondary | ICD-10-CM | POA: Insufficient documentation

## 2019-11-22 LAB — CBC WITH DIFFERENTIAL/PLATELET
Abs Immature Granulocytes: 0.07 10*3/uL (ref 0.00–0.07)
Basophils Absolute: 0.1 10*3/uL (ref 0.0–0.1)
Basophils Relative: 0 %
Eosinophils Absolute: 0 10*3/uL (ref 0.0–0.5)
Eosinophils Relative: 0 %
HCT: 47.3 % (ref 39.0–52.0)
Hemoglobin: 17.1 g/dL — ABNORMAL HIGH (ref 13.0–17.0)
Immature Granulocytes: 0 %
Lymphocytes Relative: 9 %
Lymphs Abs: 1.5 10*3/uL (ref 0.7–4.0)
MCH: 29.4 pg (ref 26.0–34.0)
MCHC: 36.2 g/dL — ABNORMAL HIGH (ref 30.0–36.0)
MCV: 81.3 fL (ref 80.0–100.0)
Monocytes Absolute: 0.9 10*3/uL (ref 0.1–1.0)
Monocytes Relative: 5 %
Neutro Abs: 15 10*3/uL — ABNORMAL HIGH (ref 1.7–7.7)
Neutrophils Relative %: 86 %
Platelets: 227 10*3/uL (ref 150–400)
RBC: 5.82 MIL/uL — ABNORMAL HIGH (ref 4.22–5.81)
RDW: 14.2 % (ref 11.5–15.5)
WBC: 17.5 10*3/uL — ABNORMAL HIGH (ref 4.0–10.5)
nRBC: 0 % (ref 0.0–0.2)

## 2019-11-22 LAB — BASIC METABOLIC PANEL
Anion gap: 10 (ref 5–15)
BUN: 7 mg/dL (ref 6–20)
CO2: 30 mmol/L (ref 22–32)
Calcium: 9.5 mg/dL (ref 8.9–10.3)
Chloride: 100 mmol/L (ref 98–111)
Creatinine, Ser: 1.04 mg/dL (ref 0.61–1.24)
GFR calc Af Amer: >60 mL/min (ref >60)
GFR calc non Af Amer: >60 mL/min (ref >60)
Glucose, Bld: 97 mg/dL (ref 70–99)
Potassium: 4.2 mmol/L (ref 3.5–5.1)
Sodium: 140 mmol/L (ref 135–145)

## 2019-11-22 LAB — LACTIC ACID, PLASMA: Lactic Acid, Venous: 0.8 mmol/L (ref 0.5–1.9)

## 2019-11-22 MED ORDER — IOHEXOL 300 MG/ML  SOLN
100.0000 mL | Freq: Once | INTRAMUSCULAR | Status: AC | PRN
  Administered 2019-11-22: 100 mL via INTRAVENOUS

## 2019-11-22 MED ORDER — CLINDAMYCIN HCL 150 MG PO CAPS: 300.0000 mg | ORAL_CAPSULE | Freq: Three times a day (TID) | ORAL | 0 refills | Status: AC

## 2019-11-22 MED ORDER — CLINDAMYCIN PHOSPHATE 600 MG/50ML IV SOLN
600.0000 mg | Freq: Once | INTRAVENOUS | Status: AC
  Administered 2019-11-22: 600 mg via INTRAVENOUS
  Filled 2019-11-22: qty 50

## 2019-11-22 MED ORDER — KETOROLAC TROMETHAMINE 30 MG/ML IJ SOLN
30.0000 mg | Freq: Once | INTRAMUSCULAR | Status: AC
  Administered 2019-11-22: 30 mg via INTRAVENOUS
  Filled 2019-11-22: qty 1

## 2019-11-22 MED ORDER — MORPHINE SULFATE (PF) 4 MG/ML IV SOLN
4.0000 mg | Freq: Once | INTRAVENOUS | Status: AC
  Administered 2019-11-22: 4 mg via INTRAVENOUS
  Filled 2019-11-22: qty 1

## 2019-11-22 MED ORDER — LIDOCAINE HCL (PF) 1 % IJ SOLN
10.0000 mL | Freq: Once | INTRAMUSCULAR | Status: AC
  Administered 2019-11-22: 10 mL
  Filled 2019-11-22: qty 10

## 2019-11-22 NOTE — Discharge Instructions (Signed)
Please pick up antibiotics and take as prescribed Return to the ED in 48 hours for packing removal and wound check  While at home you can apply warm compresses and do sitz baths to help with further drainage naturally  Take Ibuprofen and Tylenol as needed for pain  Follow up with [facility] for further evaluation given recurrent abscesses

## 2019-11-22 NOTE — ED Provider Notes (Signed)
[name] EMERGENCY DEPARTMENT Provider Note   CSN: [id number] Arrival date & time: 11/22/19  1536     History Chief Complaint  Patient presents with  . Abscess    pilonidal cyst    Drew [name] is a 24 y.o. male who presents to the ED today via EMS for complaint of "pilonidal cyst" x a few weeks. Pt reports he has had this area on his left buttock for a few weeks but it has grown in size recently. He is having difficulty having a bowel movement due to pain however has not taken anything for pain. Pt reports he has felt hot and cold intermittently but is unsure if he has had a fever. He reports similar symptoms a few months ago which required drainage. Pt denies abdominal pain, nausea, vomiting, testicular pain/swelling, or any other associated symptoms.   Per chart review pt was seen in the ED on 1/31 when he was found to have a pilonidal cyst. I&D was performed and he was placed on antibiotics. He was encouraged to follow up with general surgery however did not do so.    The history is provided by the patient and medical records.       Past Medical History:  Diagnosis Date  . Abscess   . Asthma     There are no problems to display for this patient.   Past Surgical History:  Procedure Laterality Date  . FRACTURE SURGERY         Family History  Adopted: Yes    Social History   Tobacco Use  . Smoking status: Light Tobacco Smoker    Types: Cigarettes  . Smokeless tobacco: Never Used  Substance Use Topics  . Alcohol use: Not Currently  . Drug use: Yes    Types: Marijuana    Home Medications Prior to Admission medications   Medication Sig Start Date End Date Taking? Authorizing Provider  clindamycin (CLEOCIN) 150 MG capsule Take 2 capsules (300 mg total) by mouth 3 (three) times daily for 7 days. 11/22/19 11/29/19  Tanda Rockers, PA-C  ibuprofen (ADVIL) 600 MG tablet Take 1 tablet (600 mg total) by mouth every 6 (six) hours as  needed. Patient not taking: Reported on 11/22/2019 09/26/19   Derwood Kaplan, MD  predniSONE (DELTASONE) 20 MG tablet Take 2 tablets (40 mg total) by mouth daily. Patient not taking: Reported on 06/16/2019 05/28/19   Renne Crigler, PA-C    Allergies    Dairy aid [lactase], Silver, and Chocolate  Review of Systems   Review of Systems  Constitutional: Negative for chills and fever.  Genitourinary:       + abscess to buttocks    Physical Exam Updated Vital Signs BP (!) 134/103 (BP Location: Left Arm)   Pulse 94   Temp 99.3 F (37.4 C) (Oral)   Resp 12   SpO2 99%   Physical Exam Vitals and nursing note reviewed.  Constitutional:      Appearance: He is not ill-appearing.  HENT:     Head: Normocephalic and atraumatic.  Eyes:     Conjunctiva/sclera: Conjunctivae normal.  Cardiovascular:     Rate and Rhythm: Normal rate and regular rhythm.     Pulses: Normal pulses.  Pulmonary:     Effort: Pulmonary effort is normal.     Breath sounds: Normal breath sounds. No wheezing, rhonchi or rales.  Abdominal:     Palpations: Abdomen is soft.     Tenderness: There is no abdominal tenderness. There  is no guarding or rebound.  Genitourinary:    Comments: Male chaperone present for exam. 4 x 3 cm left perirectal abscess appreciated. DRE performed with some involvement into the rectum itself.  Musculoskeletal:     Cervical back: Neck supple.  Skin:    General: Skin is warm and dry.  Neurological:     Mental Status: He is alert.     ED Results / Procedures / Treatments   Labs (all labs ordered are listed, but only abnormal results are displayed) Labs Reviewed  CBC WITH DIFFERENTIAL/PLATELET - Abnormal; Notable for the following components:      Result Value   WBC 17.5 (*)    RBC 5.82 (*)    Hemoglobin 17.1 (*)    MCHC 36.2 (*)    Neutro Abs 15.0 (*)    All other components within normal limits  BASIC METABOLIC PANEL  LACTIC ACID, PLASMA  LACTIC ACID, PLASMA     EKG None  Radiology CT PELVIS W CONTRAST  Result Date: 11/22/2019 CLINICAL DATA:  Rectal abscess EXAM: CT PELVIS WITH CONTRAST TECHNIQUE: Multidetector CT imaging of the pelvis was performed using the standard protocol following the bolus administration of intravenous contrast. CONTRAST:  OMNIPAQUE IOHEXOL 300 MG/ML  SOLN COMPARISON:  09/26/2019 FINDINGS: Urinary Tract: Distal ureters and bladder are unremarkable without urinary tract calculi. Bowel:  No bowel obstruction or ileus.  No bowel wall thickening. Vascular/Lymphatic: Enlarged bilateral inguinal lymph nodes are seen. Largest in the right inguinal region measures 13 mm in short axis. Vascular structures appear unremarkable. Reproductive:  Prostate is unremarkable. Other: Multilocular fluid collections are seen within the gluteal cleft and the medial aspect of the right gluteal fold. Largest collection in the right gluteal fold measures 17 x 28 mm reference image 102, located in the immediate subcutaneous tissues. Second loculated collection more superiorly within the gluteal cleft measures 16 x 11 mm on image 87. Subcutaneous fat stranding consistent with adjacent cellulitis. Inflammatory changes and abscess do not involve the anus or rectum. Musculoskeletal: No acute bony abnormalities. Reconstructed images demonstrate no additional findings. IMPRESSION: 1. Enlarging subcutaneous abscess and progressive cellulitis involving the right gluteal fold and gluteal cleft. Inflammatory changes do not involve the anal verge or rectum. 2. Reactive bilateral inguinal lymphadenopathy. Electronically Signed   By: Sharlet Salina M.D.   On: 11/22/2019 21:12    Procedures .Marland KitchenIncision and Drainage  Date/Time: 11/22/2019 10:13 PM Performed by: Tanda Rockers, PA-C Authorized by: Tanda Rockers, PA-C   Consent:    Consent obtained:  Verbal   Consent given by:  Patient   Risks discussed:  Bleeding, infection, incomplete drainage and  pain Location:    Type:  Abscess   Size:  4 x 3   Location:  Anogenital   Anogenital location:  Perianal Pre-procedure details:    Skin preparation:  Betadine Anesthesia (see MAR for exact dosages):    Anesthesia method:  Local infiltration   Local anesthetic:  Lidocaine 1% w/o epi Procedure type:    Complexity:  Complex Procedure details:    Incision types:  Stab incision   Incision depth:  Dermal   Scalpel blade:  11   Wound management:  Probed and deloculated   Drainage:  Purulent   Drainage amount:  Copious   Packing materials:  1/4 in gauze   Amount 1/4":  8 inches Post-procedure details:    Patient tolerance of procedure:  Tolerated well, no immediate complications   (including critical care time)  Medications Ordered in  ED Medications  lidocaine (PF) (XYLOCAINE) 1 % injection 10 mL (has no administration in time range)  morphine 4 MG/ML injection 4 mg (4 mg Intravenous Given 11/22/19 1932)  clindamycin (CLEOCIN) IVPB 600 mg (0 mg Intravenous Stopped 11/22/19 2134)  ketorolac (TORADOL) 30 MG/ML injection 30 mg (30 mg Intravenous Given 11/22/19 2018)  iohexol (OMNIPAQUE) 300 MG/ML solution 100 mL (100 mLs Intravenous Contrast Given 11/22/19 2104)    ED Course  I have reviewed the triage vital signs and the nursing notes.  Pertinent labs & imaging results that were available during my care of the patient were reviewed by me and considered in my medical decision making (see chart for details).  Clinical Course as of Nov 21 2209  Mon Nov 22, 2019  1958 WBC(!): 17.5 [MV]    Clinical Course User Index [MV] Eustaquio Maize, [place]   MDM Rules/Calculators/A&P                      24 year old male who presents to the ED today complaining of abscess to his left buttock.  He states he has been hot and cold at home however denies fevers.  Temp on arrival 99.3.  Pulse 94.  Room present for exam.  Patient appears to have perirectal abscess that is very close to the rectum.  Given  this will obtain CT pelvis and screening labs.  CBC with leukocytosis 17,500.  Patient started on clindamycin IV.  Lactic acid within normal limits. BMP without significant abnormalities.  CT scan returned with superficial abscess and cellulitis which does not involve the rectum. I&D performed at bedside with copious amount of purulent drainage. Packing placed. Pt advised to return to the ED in 48 hours for packing removal. Discharged on clindamycin. Advised to follow with [place] surgery for further eval. Pt is in agreement with plan and stable for discharge home.   This note was prepared using Dragon voice recognition software and may include unintentional dictation errors due to the inherent limitations of voice recognition software.  Final Clinical Impression(s) / ED Diagnoses Final diagnoses:  Perianal abscess    Rx / DC Orders ED Discharge Orders         Ordered    clindamycin (CLEOCIN) 150 MG capsule  3 times daily     11/22/19 2208           Discharge Instructions     Please pick up antibiotics and take as prescribed Return to the ED in 48 hours for packing removal and wound check  While at home you can apply warm compresses and do sitz baths to help with further drainage naturally  Take Ibuprofen and Tylenol as needed for pain  Follow up with [facility] for further evaluation given recurrent abscesses        Eustaquio Maize, PA-C 11/22/19 2214    Lucrezia Starch, MD 11/23/19 [phone]

## 2019-11-22 NOTE — ED Triage Notes (Signed)
Pt arrives by ambulance from home for a pilonidal cyst.  Pt has had this before.  This began a few weeks ago but has been getting worse, has not been draining.

## 2019-11-28 ENCOUNTER — Other Ambulatory Visit: Payer: Self-pay

## 2019-11-28 ENCOUNTER — Encounter (HOSPITAL_COMMUNITY): Payer: Self-pay | Admitting: Emergency Medicine

## 2019-11-28 ENCOUNTER — Emergency Department (HOSPITAL_COMMUNITY)
Admission: EM | Admit: 2019-11-28 | Discharge: 2019-11-28 | Disposition: A | Discharge status: Home / Self Care | Payer: Self-pay | Attending: Emergency Medicine | Admitting: Emergency Medicine

## 2019-11-28 DIAGNOSIS — Z720 Tobacco use: Secondary | ICD-10-CM | POA: Insufficient documentation

## 2019-11-28 DIAGNOSIS — L0501 Pilonidal cyst with abscess: Secondary | ICD-10-CM | POA: Insufficient documentation

## 2019-11-28 DIAGNOSIS — Z79899 Other long term (current) drug therapy: Secondary | ICD-10-CM | POA: Insufficient documentation

## 2019-11-28 MED ORDER — LIDOCAINE-EPINEPHRINE (PF) 2 %-1:200000 IJ SOLN
10.0000 mL | Freq: Once | INTRAMUSCULAR | Status: AC
  Administered 2019-11-28: 10 mL
  Filled 2019-11-28: qty 10

## 2019-11-28 NOTE — Discharge Instructions (Addendum)
Please complete your course of clindamycin that was prescribed to you a few days ago.  Continue doing warm soaks and warm compresses.  Packing will need to be removed in 2 days if it does not fall out on its own you should return to the emergency department or follow-up with the surgery clinic for packing removal.  If you develop fevers, worsening pain or swelling, have significantly increased drainage or any other new or concerning symptoms please return to the ED for reevaluation.

## 2019-11-28 NOTE — ED Triage Notes (Signed)
Patient here from home with complaints of a Pilonidal cyst. States that he has not "had time" to follow up with general surgery. Seen for same on 3/29.

## 2019-11-28 NOTE — ED Notes (Signed)
Nonadherent dressing applied to site with paper tape. Patient counseled on signs for infection and what to come back for. Patient agreeable to plan of care and denies questions

## 2019-11-28 NOTE — ED Provider Notes (Signed)
[facility]-EMERGENCY DEPT Provider Note   CSN: [id number] Arrival date & time: 11/28/19  1322     History Chief Complaint  Patient presents with  . Abscess  . Cyst    Drew [name] is a 24 y.o. male.  Drew [name] is a 24 y.o. male with a history of asthma and recurrent abscesses, who presents to the ED with complaints of pilonidal cyst. Patient states he has had multiple of these in the past, has been referred to general surgery but has not yet followed up. He was seen for similar on 3/29, and was found to have an abscess of the right buttock and gluteal cleft, CT imaging performed that showed no rectal involvement. Incision and drainage was performed and packing was placed. Patient states packing fell out on its own in this area seem to be improving but over the past 2 days he has noticed an area of swelling around the gluteal cleft that has become increasingly large and tender to palpation. He has not noted any expressible drainage. No fevers or chills. No rectal pain or pain with defecation. He does state it is very painful to sit.        Past Medical History:  Diagnosis Date  . Abscess   . Asthma     There are no problems to display for this patient.   Past Surgical History:  Procedure Laterality Date  . FRACTURE SURGERY         Family History  Adopted: Yes    Social History   Tobacco Use  . Smoking status: Light Tobacco Smoker    Types: Cigarettes  . Smokeless tobacco: Never Used  Substance Use Topics  . Alcohol use: Not Currently  . Drug use: Yes    Types: Marijuana    Home Medications Prior to Admission medications   Medication Sig Start Date End Date Taking? Authorizing Provider  clindamycin (CLEOCIN) 150 MG capsule Take 2 capsules (300 mg total) by mouth 3 (three) times daily for 7 days. 11/22/19 11/29/19  Tanda Rockers, PA-C  ibuprofen (ADVIL) 600 MG tablet Take 1 tablet (600 mg total) by mouth every 6 (six)  hours as needed. Patient not taking: Reported on 11/22/2019 09/26/19   Derwood Kaplan, MD  predniSONE (DELTASONE) 20 MG tablet Take 2 tablets (40 mg total) by mouth daily. Patient not taking: Reported on 06/16/2019 05/28/19   Renne Crigler, PA-C    Allergies    Dairy aid [lactase], Silver, and Chocolate  Review of Systems   Review of Systems  Constitutional: Negative for chills and fever.  Gastrointestinal: Negative for blood in stool and rectal pain.  Skin:       Abscess  All other systems reviewed and are negative.   Physical Exam Updated Vital Signs BP 120/85 (BP Location: Left Arm)   Pulse 89   Temp 98.6 F (37 C) (Oral)   Resp 17   Ht 5' 6.5" (1.689 m)   SpO2 100%   BMI 26.23 kg/m   Physical Exam Vitals and nursing note reviewed.  Constitutional:      General: He is not in acute distress.    Appearance: Normal appearance. He is well-developed and normal weight. He is not ill-appearing or diaphoretic.     Comments: Well-appearing and in no distress  HENT:     Head: Normocephalic and atraumatic.  Eyes:     General:        Right eye: No discharge.  Left eye: No discharge.  Pulmonary:     Effort: Pulmonary effort is normal. No respiratory distress.  Genitourinary:    Comments: 3 x 2 cm area of fluctuance at the top of the gluteal cleft, no expressible drainage or surrounding cellulitis, area of previous abscess drainage noted on the inner right buttock which appears to be improved with no expressible drainage or surrounding erythema, packing has been removed Skin:    General: Skin is warm and dry.  Neurological:     Mental Status: He is alert and oriented to person, place, and time.     Coordination: Coordination normal.  Psychiatric:        Mood and Affect: Mood normal.        Behavior: Behavior normal.     ED Results / Procedures / Treatments   Labs (all labs ordered are listed, but only abnormal results are displayed) Labs Reviewed - No data to  display  EKG None  Radiology No results found.  Procedures .Marland KitchenIncision and Drainage  Date/Time: 11/28/2019 3:01 PM Performed by: Jacqlyn Larsen, PA-C Authorized by: Jacqlyn Larsen, PA-C   Consent:    Consent obtained:  Verbal   Consent given by:  Patient   Risks discussed:  Damage to other organs, bleeding, incomplete drainage, infection and pain   Alternatives discussed:  No treatment Location:    Type:  Pilonidal cyst   Size:  3 x 2 cm   Location:  Anogenital   Anogenital location:  Pilonidal Pre-procedure details:    Skin preparation:  Chloraprep Anesthesia (see MAR for exact dosages):    Anesthesia method:  Local infiltration   Local anesthetic:  Lidocaine 2% WITH epi Procedure type:    Complexity:  Complex Procedure details:    Incision types:  Single straight   Incision depth:  Dermal   Scalpel blade:  11   Wound management:  Probed and deloculated   Drainage:  Bloody and purulent   Drainage amount:  Copious   Packing materials:  1/4 in iodoform gauze Post-procedure details:    Patient tolerance of procedure:  Tolerated well, no immediate complications   (including critical care time)  Medications Ordered in ED Medications  lidocaine-EPINEPHrine (XYLOCAINE W/EPI) 2 %-1:200000 (PF) injection 10 mL (10 mLs Infiltration Given 11/28/19 1514)    ED Course  I have reviewed the triage vital signs and the nursing notes.  Pertinent labs & imaging results that were available during my care of the patient were reviewed by me and considered in my medical decision making (see chart for details).    MDM Rules/Calculators/A&P                     24 year old male presents with pilonidal abscess.  On arrival he is afebrile with normal vitals and is well appearing.  He was recently seen in the ED for abscess drainage on 3/29, he states this area has improved but a new area has formed and is very painful, he denies any drainage from this area.  States packing came out a few  days ago while doing a warm soak.  He has had recurrent abscesses in this area, but has not yet followed up with general surgery.  He has been taking the clindamycin he was prescribed.  On exam he has a fluctuant pilonidal abscess, this does not track down towards the rectum and he has no rectal pain or pain with defecation.  Area was anesthetized and incision and drainage performed with  a copious amount of bloody purulent drainage.  Packing placed.  Stressed the importance of completing course of antibiotics and general surgery follow-up.  Patient expresses understanding and agreement.  Discharged home in good condition.  Final Clinical Impression(s) / ED Diagnoses Final diagnoses:  Pilonidal abscess    Rx / DC Orders ED Discharge Orders    None       Dartha Lodge, [place] 11/28/19 1553    Terald Sleeper, MD 11/29/19 [phone]

## 2020-03-15 ENCOUNTER — Other Ambulatory Visit: Payer: Self-pay

## 2020-03-15 ENCOUNTER — Encounter (HOSPITAL_COMMUNITY): Payer: Self-pay | Admitting: Emergency Medicine

## 2020-03-15 ENCOUNTER — Emergency Department (HOSPITAL_COMMUNITY)
Admission: EM | Admit: 2020-03-15 | Discharge: 2020-03-15 | Disposition: A | Discharge status: Home / Self Care | Payer: Self-pay | Attending: Emergency Medicine | Admitting: Emergency Medicine

## 2020-03-15 DIAGNOSIS — J45909 Unspecified asthma, uncomplicated: Secondary | ICD-10-CM | POA: Insufficient documentation

## 2020-03-15 DIAGNOSIS — Y929 Unspecified place or not applicable: Secondary | ICD-10-CM | POA: Insufficient documentation

## 2020-03-15 DIAGNOSIS — X58XXXA Exposure to other specified factors, initial encounter: Secondary | ICD-10-CM | POA: Insufficient documentation

## 2020-03-15 DIAGNOSIS — Y999 Unspecified external cause status: Secondary | ICD-10-CM | POA: Insufficient documentation

## 2020-03-15 DIAGNOSIS — T63481A Toxic effect of venom of other arthropod, accidental (unintentional), initial encounter: Secondary | ICD-10-CM | POA: Insufficient documentation

## 2020-03-15 DIAGNOSIS — Y939 Activity, unspecified: Secondary | ICD-10-CM | POA: Insufficient documentation

## 2020-03-15 DIAGNOSIS — F17210 Nicotine dependence, cigarettes, uncomplicated: Secondary | ICD-10-CM | POA: Insufficient documentation

## 2020-03-15 DIAGNOSIS — T7840XA Allergy, unspecified, initial encounter: Secondary | ICD-10-CM

## 2020-03-15 MED ORDER — DIPHENHYDRAMINE HCL 50 MG/ML IJ SOLN
25.0000 mg | Freq: Once | INTRAMUSCULAR | Status: AC
  Administered 2020-03-15: 25 mg via INTRAMUSCULAR
  Filled 2020-03-15: qty 1

## 2020-03-15 MED ORDER — DEXAMETHASONE SODIUM PHOSPHATE 4 MG/ML IJ SOLN
4.0000 mg | Freq: Once | INTRAMUSCULAR | Status: AC
  Administered 2020-03-15: 4 mg via INTRAMUSCULAR
  Filled 2020-03-15: qty 1

## 2020-03-15 MED ORDER — DEXAMETHASONE SODIUM PHOSPHATE 10 MG/ML IJ SOLN: 10.0000 mg | Freq: Once | INTRAMUSCULAR | Status: DC

## 2020-03-15 MED ORDER — PREDNISONE 20 MG PO TABS: 40.0000 mg | ORAL_TABLET | Freq: Every day | ORAL | 0 refills | Status: AC

## 2020-03-15 NOTE — ED Triage Notes (Signed)
Patient reports stung by wasp on posterior neck at 1430. States rash to face started after sting. Denies throat swelling and SOB. Speaking in full sentences without difficulty.

## 2020-03-15 NOTE — ED Provider Notes (Signed)
[facility]-EMERGENCY DEPT Provider Note   CSN: [id number] Arrival date & time: 03/15/20  1804     History Chief Complaint  Patient presents with  . Insect Bite    Drew [name] is a 24 y.o. male with no pertinent past medical history that presents to the emergency department today for allergic reaction.  Patient was stung by a wasp in his posterior neck at 230, states that he started noticing some rash to his face after the sting about 10 minutes later.  States that his face has been progressively getting more lesions around his face.  States that they hurt him.  Denies any lesions in his oral mucosa, genitals.  Denies any chest pain, shortness of breath, difficulty speaking, throat swelling, itchy throat, itchy eyes.  States that he is unsure if he is allergic to wasps.  States that nothing like this is ever happened to him before.  Patient states that he is fairly healthy.  Denies any new medications.  Has no other complaints.  Has not tried anything for this.  Is not itchy.  Denies any methamphetamine use, denies any burns.  States that this occurred 10 minutes after being bitten by a wasp.  HPI     Past Medical History:  Diagnosis Date  . Abscess   . Asthma     There are no problems to display for this patient.   Past Surgical History:  Procedure Laterality Date  . FRACTURE SURGERY         Family History  Adopted: Yes    Social History   Tobacco Use  . Smoking status: Light Tobacco Smoker    Types: Cigarettes  . Smokeless tobacco: Never Used  Vaping Use  . Vaping Use: Never used  Substance Use Topics  . Alcohol use: Not Currently  . Drug use: Yes    Types: Marijuana    Home Medications Prior to Admission medications   Medication Sig Start Date End Date Taking? Authorizing Provider  ibuprofen (ADVIL) 600 MG tablet Take 1 tablet (600 mg total) by mouth every 6 (six) hours as needed. Patient not taking: Reported on 11/22/2019  09/26/19   Derwood Kaplan, MD  predniSONE (DELTASONE) 20 MG tablet Take 2 tablets (40 mg total) by mouth daily for 4 days. 03/15/20 03/19/20  Farrel Gordon, PA-C    Allergies    Dairy aid [lactase], Silver, and Chocolate  Review of Systems   Review of Systems  Constitutional: Negative for diaphoresis, fatigue and fever.       Is able to speak to me in full sentences, no respiratory compromise.  Eyes: Negative for visual disturbance.  Respiratory: Negative for cough, choking, chest tightness and shortness of breath.   Cardiovascular: Negative for chest pain.  Gastrointestinal: Negative for abdominal pain, nausea and vomiting.  Musculoskeletal: Negative for back pain and myalgias.  Skin: Positive for rash. Negative for color change, pallor and wound.  Neurological: Negative for syncope, weakness, light-headedness, numbness and headaches.  Psychiatric/Behavioral: Negative for behavioral problems and confusion.    Physical Exam Updated Vital Signs BP 125/81 (BP Location: Left Arm)   Pulse 75   Temp 98.8 F (37.1 C) (Oral)   Resp 17   SpO2 100%   Physical Exam Constitutional:      General: He is not in acute distress.    Appearance: Normal appearance. He is not ill-appearing, toxic-appearing or diaphoretic.  HENT:     Mouth/Throat:     Mouth: Mucous membranes are moist.  Pharynx: Oropharynx is clear. No oropharyngeal exudate.  Eyes:     Extraocular Movements: Extraocular movements intact.     Conjunctiva/sclera: Conjunctivae normal.  Cardiovascular:     Rate and Rhythm: Normal rate and regular rhythm.     Pulses: Normal pulses.     Heart sounds: Normal heart sounds.  Pulmonary:     Effort: Pulmonary effort is normal. No respiratory distress.     Breath sounds: Normal breath sounds. No stridor. No wheezing, rhonchi or rales.  Chest:     Chest wall: No tenderness.  Abdominal:     General: Abdomen is flat. There is no distension.     Tenderness: There is no abdominal  tenderness.  Musculoskeletal:        General: Normal range of motion.  Skin:    General: Skin is warm and dry.     Capillary Refill: Capillary refill takes less than 2 seconds.     Comments: Patient with small bite on posterior neck, no swelling or overlying erythema. Face with oval shaped lesion on cheek which looks like an old burn, erythematous rash extending over  cheeks and crossing bridge of nose.  See picture below.  No warmth or abscesses noted.  No lesions noted elsewhere. No hives. No pustules. NO lesions on lips or in oral mucosa.  Neurological:     General: No focal deficit present.     Mental Status: He is alert and oriented to person, place, and time.     Cranial Nerves: No cranial nerve deficit.     Sensory: No sensory deficit.     Motor: No weakness.     Coordination: Coordination normal.     Gait: Gait normal.  Psychiatric:        Mood and Affect: Mood normal.        Behavior: Behavior normal.        Thought Content: Thought content normal.         ED Results / Procedures / Treatments   Labs (all labs ordered are listed, but only abnormal results are displayed) Labs Reviewed - No data to display  EKG None  Radiology No results found.  Procedures Procedures (including critical care time)  Medications Ordered in ED Medications  diphenhydrAMINE (BENADRYL) injection 25 mg (25 mg Intramuscular Given 03/15/20 2255)  dexamethasone (DECADRON) injection 4 mg (4 mg Intramuscular Given 03/15/20 2255)    ED Course  I have reviewed the triage vital signs and the nursing notes.  Pertinent labs & imaging results that were available during my care of the patient were reviewed by me and considered in my medical decision making (see chart for details).    MDM Rules/Calculators/A&P                         Drew [name] is a 24 y.o. male with no pertinent past medical history that presents to the emergency department today for allergic reaction.  No airway  compromise, no signs of anaphylaxis. Lesions do appear to look like a burn, patient denies any meth or IV drug use.  Patient is twitching, and is irritable when he is touched.  Does not appear to be cellulitic. DO not think that rash is related to wasp sting, however will treat for allergic reaction. Benadryl and steroids given with strict return precautions.   Doubt need for further emergent work up at this time. I explained the diagnosis and have given explicit precautions to return to the ER  including for any other new or worsening symptoms. The patient understands and accepts the medical plan as it's been dictated and I have answered their questions. Discharge instructions concerning home care and prescriptions have been given. The patient is STABLE and is discharged to home in good condition.  I discussed this case with my attending physician who cosigned this note including patient's presenting symptoms, physical exam, and planned diagnostics and interventions. Attending physician stated agreement with plan or made changes to plan which were implemented.   Attending physician assessed patient at bedside.  Final Clinical Impression(s) / ED Diagnoses Final diagnoses:  Allergic reaction, initial encounter    Rx / DC Orders ED Discharge Orders         Ordered    predniSONE (DELTASONE) 20 MG tablet  Daily     Discontinue  Reprint     03/15/20 2312           Farrel Gordon, PA-C 03/16/20 1137    Tilden Fossa, MD 03/16/20 [phone]

## 2020-03-15 NOTE — Discharge Instructions (Signed)
You are seen today for allergic reaction, I want you to take 1 Benadryl at night as prescribed on the bottle for the next 3 days.  I also want you to take the prednisone as prescribed.  I want you to follow-up with [facility] in the next couple of days.  If you start feeling that the rash is spreading, if you have fevers or there are new or worsening concerning symptoms they need to come back to the emergency department.  Use the attached instructions.

## 2020-04-20 ENCOUNTER — Encounter (HOSPITAL_COMMUNITY): Payer: Self-pay

## 2020-04-20 ENCOUNTER — Emergency Department (HOSPITAL_COMMUNITY): Payer: Self-pay

## 2020-04-20 ENCOUNTER — Emergency Department (HOSPITAL_COMMUNITY)
Admission: EM | Admit: 2020-04-20 | Discharge: 2020-04-20 | Disposition: A | Discharge status: Home / Self Care | Payer: Self-pay | Attending: Emergency Medicine | Admitting: Emergency Medicine

## 2020-04-20 ENCOUNTER — Other Ambulatory Visit: Payer: Self-pay

## 2020-04-20 DIAGNOSIS — F17210 Nicotine dependence, cigarettes, uncomplicated: Secondary | ICD-10-CM | POA: Insufficient documentation

## 2020-04-20 DIAGNOSIS — R200 Anesthesia of skin: Secondary | ICD-10-CM | POA: Insufficient documentation

## 2020-04-20 DIAGNOSIS — R112 Nausea with vomiting, unspecified: Secondary | ICD-10-CM | POA: Insufficient documentation

## 2020-04-20 DIAGNOSIS — J45909 Unspecified asthma, uncomplicated: Secondary | ICD-10-CM | POA: Insufficient documentation

## 2020-04-20 DIAGNOSIS — R519 Headache, unspecified: Secondary | ICD-10-CM | POA: Insufficient documentation

## 2020-04-20 DIAGNOSIS — Z20822 Contact with and (suspected) exposure to covid-19: Secondary | ICD-10-CM | POA: Insufficient documentation

## 2020-04-20 LAB — CBC WITH DIFFERENTIAL/PLATELET
Abs Immature Granulocytes: 0.08 10*3/uL — ABNORMAL HIGH (ref 0.00–0.07)
Basophils Absolute: 0.1 10*3/uL (ref 0.0–0.1)
Basophils Relative: 1 %
Eosinophils Absolute: 0.1 10*3/uL (ref 0.0–0.5)
Eosinophils Relative: 1 %
HCT: 53.4 % — ABNORMAL HIGH (ref 39.0–52.0)
Hemoglobin: 19.4 g/dL — ABNORMAL HIGH (ref 13.0–17.0)
Immature Granulocytes: 1 %
Lymphocytes Relative: 21 %
Lymphs Abs: 2.4 10*3/uL (ref 0.7–4.0)
MCH: 29.5 pg (ref 26.0–34.0)
MCHC: 36.3 g/dL — ABNORMAL HIGH (ref 30.0–36.0)
MCV: 81.3 fL (ref 80.0–100.0)
Monocytes Absolute: 0.6 10*3/uL (ref 0.1–1.0)
Monocytes Relative: 5 %
Neutro Abs: 8.6 10*3/uL — ABNORMAL HIGH (ref 1.7–7.7)
Neutrophils Relative %: 71 %
Platelets: 309 10*3/uL (ref 150–400)
RBC: 6.57 MIL/uL — ABNORMAL HIGH (ref 4.22–5.81)
RDW: 14.4 % (ref 11.5–15.5)
WBC: 11.9 10*3/uL — ABNORMAL HIGH (ref 4.0–10.5)
nRBC: 0 % (ref 0.0–0.2)

## 2020-04-20 LAB — PROTIME-INR
INR: 1 (ref 0.8–1.2)
Prothrombin Time: 12.4 seconds (ref 11.4–15.2)

## 2020-04-20 LAB — COMPREHENSIVE METABOLIC PANEL
ALT: 8 U/L (ref 0–44)
AST: 15 U/L (ref 15–41)
Albumin: 5.4 g/dL — ABNORMAL HIGH (ref 3.5–5.0)
Alkaline Phosphatase: 67 U/L (ref 38–126)
Anion gap: 15 (ref 5–15)
BUN: 24 mg/dL — ABNORMAL HIGH (ref 6–20)
CO2: 28 mmol/L (ref 22–32)
Calcium: 10.2 mg/dL (ref 8.9–10.3)
Chloride: 95 mmol/L — ABNORMAL LOW (ref 98–111)
Creatinine, Ser: 1.38 mg/dL — ABNORMAL HIGH (ref 0.61–1.24)
GFR calc Af Amer: >60 mL/min (ref >60)
GFR calc non Af Amer: >60 mL/min (ref >60)
Glucose, Bld: 83 mg/dL (ref 70–99)
Potassium: 4.3 mmol/L (ref 3.5–5.1)
Sodium: 138 mmol/L (ref 135–145)
Total Bilirubin: 1.3 mg/dL — ABNORMAL HIGH (ref 0.3–1.2)
Total Protein: 9.1 g/dL — ABNORMAL HIGH (ref 6.5–8.1)

## 2020-04-20 LAB — LIPASE, BLOOD: Lipase: 28 U/L (ref 11–51)

## 2020-04-20 LAB — SARS CORONAVIRUS 2 BY RT PCR (HOSPITAL ORDER, PERFORMED IN ~~LOC~~ HOSPITAL LAB): SARS Coronavirus 2: NEGATIVE

## 2020-04-20 MED ORDER — IOHEXOL 350 MG/ML SOLN
80.0000 mL | Freq: Once | INTRAVENOUS | Status: AC | PRN
  Administered 2020-04-20: 80 mL via INTRAVENOUS

## 2020-04-20 MED ORDER — SODIUM CHLORIDE (PF) 0.9 % IJ SOLN
INTRAMUSCULAR | Status: AC
  Filled 2020-04-20: qty 50

## 2020-04-20 MED ORDER — PROCHLORPERAZINE EDISYLATE 10 MG/2ML IJ SOLN
10.0000 mg | Freq: Once | INTRAMUSCULAR | Status: AC
  Administered 2020-04-20: 10 mg via INTRAVENOUS
  Filled 2020-04-20: qty 2

## 2020-04-20 MED ORDER — SODIUM CHLORIDE 0.9 % IV BOLUS
1000.0000 mL | Freq: Once | INTRAVENOUS | Status: AC
  Administered 2020-04-20: 1000 mL via INTRAVENOUS

## 2020-04-20 MED ORDER — DIPHENHYDRAMINE HCL 50 MG/ML IJ SOLN
25.0000 mg | Freq: Once | INTRAMUSCULAR | Status: AC
  Administered 2020-04-20: 25 mg via INTRAVENOUS
  Filled 2020-04-20: qty 1

## 2020-04-20 MED ORDER — IOHEXOL 300 MG/ML  SOLN: 80.0000 mL | Freq: Once | INTRAMUSCULAR | Status: DC | PRN

## 2020-04-20 NOTE — ED Provider Notes (Signed)
[facility]-EMERGENCY DEPT Provider Note   CSN: [id number] Arrival date & time: 04/20/20  1211     History Chief Complaint  Patient presents with  . Emesis  . Migraine    Drew [name] is a 24 y.o. male.  The history is provided by the patient and medical records. No language interpreter was used.  Headache Pain location:  Generalized Quality:  Dull Radiates to:  Does not radiate Severity currently:  10/10 Severity at highest:  9/10 Onset quality:  Gradual Duration:  1 day Timing:  Constant Progression:  Unchanged Chronicity:  New Similar to prior headaches: no   Context: bright light and loud noise   Relieved by:  Nothing Worsened by:  Light Ineffective treatments:  None tried Associated symptoms: nausea, numbness and vomiting   Associated symptoms: no abdominal pain, no back pain, no blurred vision, no congestion, no cough, no diarrhea, no dizziness, no eye pain, no facial pain, no fatigue, no fever, no focal weakness, no hearing loss, no neck pain, no neck stiffness, no paresthesias, no photophobia, no seizures, no sinus pressure, no tingling, no visual change and no weakness   Risk factors: family hx of SAH (aneurysm)        Past Medical History:  Diagnosis Date  . Abscess   . Asthma     There are no problems to display for this patient.   Past Surgical History:  Procedure Laterality Date  . FRACTURE SURGERY         Family History  Adopted: Yes    Social History   Tobacco Use  . Smoking status: Light Tobacco Smoker    Types: Cigarettes  . Smokeless tobacco: Never Used  Vaping Use  . Vaping Use: Never used  Substance Use Topics  . Alcohol use: Not Currently  . Drug use: Yes    Types: Marijuana    Home Medications Prior to Admission medications   Medication Sig Start Date End Date Taking? Authorizing Provider  ibuprofen (ADVIL) 600 MG tablet Take 1 tablet (600 mg total) by mouth every 6 (six) hours as  needed. Patient not taking: Reported on 11/22/2019 09/26/19   Derwood Kaplan, MD    Allergies    Dairy aid [lactase], Silver, and Chocolate  Review of Systems   Review of Systems  Constitutional: Negative for chills, diaphoresis, fatigue and fever.  HENT: Negative for congestion, hearing loss and sinus pressure.   Eyes: Negative for blurred vision, photophobia, pain and visual disturbance.  Respiratory: Negative for cough, chest tightness, shortness of breath and wheezing.   Cardiovascular: Negative for chest pain, palpitations and leg swelling.  Gastrointestinal: Positive for nausea and vomiting. Negative for abdominal distention, abdominal pain, constipation and diarrhea.  Genitourinary: Negative for dysuria, flank pain and frequency.  Musculoskeletal: Negative for back pain, neck pain and neck stiffness.  Skin: Negative for rash and wound.  Neurological: Positive for numbness and headaches. Negative for dizziness, focal weakness, seizures, facial asymmetry, weakness, light-headedness and paresthesias.  Psychiatric/Behavioral: Negative for agitation and confusion.  All other systems reviewed and are negative.   Physical Exam Updated Vital Signs BP (!) 137/91 (BP Location: Left Arm)   Pulse 90   Temp 98.6 F (37 C) (Oral)   Resp 16   SpO2 96%   Physical Exam Vitals and nursing note reviewed.  Constitutional:      General: He is not in acute distress.    Appearance: He is not ill-appearing, toxic-appearing or diaphoretic.  HENT:  Head: Normocephalic and atraumatic.     Nose: Nose normal. No congestion or rhinorrhea.     Mouth/Throat:     Mouth: Mucous membranes are moist.     Pharynx: No oropharyngeal exudate or posterior oropharyngeal erythema.  Eyes:     Conjunctiva/sclera: Conjunctivae normal.     Pupils: Pupils are equal, round, and reactive to light.  Neck:     Vascular: No carotid bruit.  Cardiovascular:     Rate and Rhythm: Normal rate.     Pulses: Normal  pulses.     Heart sounds: No murmur heard.   Pulmonary:     Effort: Pulmonary effort is normal.     Breath sounds: No wheezing, rhonchi or rales.  Chest:     Chest wall: No tenderness.  Abdominal:     General: Abdomen is flat. There is no distension.     Tenderness: There is no right CVA tenderness, left CVA tenderness or rebound.  Musculoskeletal:        General: No tenderness.     Cervical back: Normal range of motion. No rigidity or tenderness.     Right lower leg: No edema.     Left lower leg: No edema.  Skin:    Coloration: Skin is not pale.     Findings: No erythema.  Neurological:     Mental Status: He is alert and oriented to person, place, and time.     GCS: GCS eye subscore is 4. GCS verbal subscore is 5. GCS motor subscore is 6.     Cranial Nerves: No cranial nerve deficit, dysarthria or facial asymmetry.     Sensory: Sensory deficit present.     Motor: No weakness, tremor, abnormal muscle tone or seizure activity.     Comments: Normal strength in arms and legs.  Good pulse in all extremities.  Subjective numbness in the left arm.  Left leg had normal sensation and face had normal sensation.  No other focal deficits on my initial exam.  Psychiatric:        Mood and Affect: Mood normal.     ED Results / Procedures / Treatments   Labs (all labs ordered are listed, but only abnormal results are displayed) Labs Reviewed  CBC WITH DIFFERENTIAL/PLATELET - Abnormal; Notable for the following components:      Result Value   WBC 11.9 (*)    RBC 6.57 (*)    Hemoglobin 19.4 (*)    HCT 53.4 (*)    MCHC 36.3 (*)    Neutro Abs 8.6 (*)    Abs Immature Granulocytes 0.08 (*)    All other components within normal limits  COMPREHENSIVE METABOLIC PANEL - Abnormal; Notable for the following components:   Chloride 95 (*)    BUN 24 (*)    Creatinine, Ser 1.38 (*)    Total Protein 9.1 (*)    Albumin 5.4 (*)    Total Bilirubin 1.3 (*)    All other components within normal limits   SARS CORONAVIRUS 2 BY RT PCR (HOSPITAL ORDER, PERFORMED IN [facility])  LIPASE, BLOOD  PROTIME-INR    EKG None  Radiology CT Angio Head W or Wo Contrast  Result Date: 04/20/2020 CLINICAL DATA:  24 year old male with family history of intracranial aneurysm and new severe headache, vomiting with intermittent left arm numbness. EXAM: CT ANGIOGRAPHY HEAD TECHNIQUE: Multidetector CT imaging of the head was performed using the standard protocol during bolus administration of intravenous contrast. Multiplanar CT image reconstructions and  MIPs were obtained to evaluate the vascular anatomy. CONTRAST:  <See Chart> OMNIPAQUE IOHEXOL 300 MG/ML SOLN, 27mL OMNIPAQUE IOHEXOL 350 MG/ML SOLN COMPARISON:  Head CT 06/16/2019 FINDINGS: CT HEAD Brain: Cerebral volume remains normal. No midline shift, ventriculomegaly, mass effect, evidence of mass lesion, intracranial hemorrhage or evidence of cortically based acute infarction. Gray-white matter differentiation is within normal limits throughout the brain. Calvarium and skull base: Negative. Paranasal sinuses: Visualized paranasal sinuses and mastoids are stable and well pneumatized. Orbits: Visualized orbits and scalp soft tissues are within normal limits. CTA HEAD Posterior circulation: Patent codominant distal vertebral arteries. No distal vertebral stenosis. Normal vertebrobasilar junction and basilar artery. The a ICAs appear dominant and patent. SCA and PCA origins are within normal limits. Posterior communicating arteries are diminutive or absent. Bilateral PCA branches are within normal limits. Anterior circulation: Both ICA siphons are patent. Both siphons appear normal. Patent and normal carotid termini. The right ACA A1 segment appears mildly dominant. The anterior communicating artery is normal. Bilateral ACA branches are within normal limits. The left MCA M1 segment and bifurcation are normal. Left MCA branches are within normal limits. Right MCA  M1 segment bifurcates early without stenosis. Right MCA branches are within normal limits. Venous sinuses: Patent superior sagittal sinus, torcula and transverse sinuses. Early contrast timing at the codominant sigmoid sinuses which are not well evaluated. Anatomic variants: Mildly dominant right ACA A1 segment. Review of the MIP images confirms the above findings IMPRESSION: 1. Normal intracranial CTA.  Negative for intracranial aneurysm. 2. Stable and normal CT appearance of the brain. Electronically Signed   By: Odessa Fleming M.D.   On: 04/20/2020 16:13    Procedures Procedures (including critical care time)  Medications Ordered in ED Medications  iohexol (OMNIPAQUE) 300 MG/ML solution 80 mL ( Intravenous Canceled Entry 04/20/20 1554)  sodium chloride (PF) 0.9 % injection (has no administration in time range)  sodium chloride 0.9 % bolus 1,000 mL (0 mLs Intravenous Stopped 04/20/20 1514)  prochlorperazine (COMPAZINE) injection 10 mg (10 mg Intravenous Given 04/20/20 1417)  diphenhydrAMINE (BENADRYL) injection 25 mg (25 mg Intravenous Given 04/20/20 1421)  iohexol (OMNIPAQUE) 350 MG/ML injection 80 mL (80 mLs Intravenous Contrast Given 04/20/20 1603)    ED Course  I have reviewed the triage vital signs and the nursing notes.  Pertinent labs & imaging results that were available during my care of the patient were reviewed by me and considered in my medical decision making (see chart for details).    MDM Rules/Calculators/A&P                          Zephan Beauchaine is a 24 y.o. male with a past medical history significant for asthma who presents with severe headache with nausea and vomiting and transient left-sided numbness.  Patient reports that his father had a hemorrhagic aneurysm in the past and patient is concerned about this.  Patient says that he was feeling fine until this morning when he woke up was having severe headache all over his head.  He reports it was 10 out of 10.  He reports  takes my Profen it only helps a little bit to a 9.5 out of 10.  He denies any visual changes or facial droop.  He does report he had some left arm and left leg intermittent numbness.  He reports his left arm still feels numb but the left leg feels normal now.  No weakness reported.  No dizziness or  speech difficulties.  He denies any past medical history including that of migraines but he does have photophobia and phonophobia.  He also would like to tested for COVID-19 although has no fevers, chills congestion, or cough.  He does report some nausea and vomiting reports he saw some flecks of blood in his vomit.  No other gross hematemesis or GI changes.  On exam, lungs clear and chest nontender.  Abdomen nontender.  Patient does report some subjective numbness in his left arm and had normal sensation and strength in the legs.  Normal strength in the arms.  Pupils are symmetric and reactive normal extraocular movements.  Clear speech.  Symmetric smile.  No numbness in the face.  Exam otherwise unremarkable.  Due to family history of intracranial aneurysm and his report of new headache with some transient neurologic deficits with the left arm and left leg numbness, we will get a CTA of his head as well as obtaining labs.  We will give a headache cocktail with a suspect this more of a complicated migraine.  We also tested for Covid given his concern to be ruled out for Covid.  Anticipate reassessment for work-up and after headache cocktail.  Patient reports his headache is significantly improved.  His work-up was overall reassuring with evidence of some dehydration but no evidence of aneurysm or bleed.  As patient is feeling much better he will be discharged home.  He agreed with plan of care and was discharged in good condition for outpatient management of his migraine.   Final Clinical Impression(s) / ED Diagnoses Final diagnoses:  Bad headache  Nausea and vomiting, intractability of vomiting not  specified, unspecified vomiting type     Clinical Impression: 1. Bad headache   2. Nausea and vomiting, intractability of vomiting not specified, unspecified vomiting type     Disposition: Discharge  Condition: Good  I have discussed the results, Dx and Tx plan with the pt(& family if present). He/she/they expressed understanding and agree(s) with the plan. Discharge instructions discussed at great length. Strict return precautions discussed and pt &/or family have verbalized understanding of the instructions. No further questions at time of discharge.    New Prescriptions   No medications on file    Follow Up: [facility] [address] [place] [address] [phone] Schedule an appointment as soon as possible for a visit    [facility] [address] [id number] mc [place] [address] [phone]       Rejina Odle, Canary Brim, MD 04/20/20 [phone]

## 2020-04-20 NOTE — Discharge Instructions (Signed)
Your work-up today did not show any concerning findings on your head imaging.  We suspect this is a migrainous type headache given the photosensitivity and phono sensitivity and improvement with medications.  We feel you are dehydrated.  Please rest and stay hydrated.  Please follow-up with your primary doctor.  If any symptoms change or worsen, please return to the nearest emergency department.  You were negative for Covid today.

## 2020-04-20 NOTE — ED Notes (Addendum)
Patient removed IV out by himself. Blood all over the chair and floor. RN stated the site might get infected if you did not properly remove the IV. PT get mad and stated" I know what I did and I dont care if it get infected and leave the room after".

## 2020-04-20 NOTE — ED Triage Notes (Signed)
Pt presents with c/o vomiting and a headache. Pt denies any hx of migraines but does report a hx of headaches. Pt reports he does have some blood noted in his vomit.

## 2020-07-02 ENCOUNTER — Other Ambulatory Visit: Payer: Self-pay

## 2020-07-02 ENCOUNTER — Encounter (HOSPITAL_COMMUNITY): Payer: Self-pay | Admitting: Emergency Medicine

## 2020-07-02 ENCOUNTER — Emergency Department (HOSPITAL_COMMUNITY)
Admission: EM | Admit: 2020-07-02 | Discharge: 2020-07-02 | Disposition: A | Discharge status: Home / Self Care | Payer: Self-pay | Attending: Emergency Medicine | Admitting: Emergency Medicine

## 2020-07-02 DIAGNOSIS — J45909 Unspecified asthma, uncomplicated: Secondary | ICD-10-CM | POA: Insufficient documentation

## 2020-07-02 DIAGNOSIS — J069 Acute upper respiratory infection, unspecified: Secondary | ICD-10-CM | POA: Insufficient documentation

## 2020-07-02 DIAGNOSIS — F17210 Nicotine dependence, cigarettes, uncomplicated: Secondary | ICD-10-CM | POA: Insufficient documentation

## 2020-07-02 DIAGNOSIS — R519 Headache, unspecified: Secondary | ICD-10-CM | POA: Insufficient documentation

## 2020-07-02 DIAGNOSIS — S0512XA Contusion of eyeball and orbital tissues, left eye, initial encounter: Secondary | ICD-10-CM | POA: Insufficient documentation

## 2020-07-02 DIAGNOSIS — X58XXXA Exposure to other specified factors, initial encounter: Secondary | ICD-10-CM | POA: Insufficient documentation

## 2020-07-02 NOTE — ED Triage Notes (Signed)
Patient states that he has been feeling bad for about 2 weeks. States it feels like his asthma. Patient endorses cough with intermittent light green sputum. Patient denies fever, endorses N/V.

## 2020-07-02 NOTE — Discharge Instructions (Addendum)
Drink plenty of fluids.  You can take Mucinex DM over-the-counter for cough.  I usually explained to my patients that when you quit smoking you do start coughing up black tar from your lungs that has accumulated from your smoking.  That is normal and will resolve as your body gets rid of everything there.  Recheck if you get a fever.  Recheck for any problems on the head injury sheet.
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
## 2020-07-22 ENCOUNTER — Encounter (HOSPITAL_COMMUNITY): Payer: Self-pay | Admitting: Emergency Medicine

## 2020-07-22 ENCOUNTER — Emergency Department (HOSPITAL_COMMUNITY)
Admission: EM | Admit: 2020-07-22 | Discharge: 2020-07-22 | Disposition: A | Discharge status: Home / Self Care | Payer: Self-pay | Attending: Emergency Medicine | Admitting: Emergency Medicine

## 2020-07-22 ENCOUNTER — Emergency Department (HOSPITAL_COMMUNITY): Payer: Self-pay

## 2020-07-22 DIAGNOSIS — W108XXA Fall (on) (from) other stairs and steps, initial encounter: Secondary | ICD-10-CM | POA: Insufficient documentation

## 2020-07-22 DIAGNOSIS — S93402A Sprain of unspecified ligament of left ankle, initial encounter: Secondary | ICD-10-CM | POA: Insufficient documentation

## 2020-07-22 DIAGNOSIS — J45909 Unspecified asthma, uncomplicated: Secondary | ICD-10-CM | POA: Insufficient documentation

## 2020-07-22 DIAGNOSIS — W19XXXA Unspecified fall, initial encounter: Secondary | ICD-10-CM

## 2020-07-22 DIAGNOSIS — F17210 Nicotine dependence, cigarettes, uncomplicated: Secondary | ICD-10-CM | POA: Insufficient documentation

## 2020-07-22 NOTE — ED Provider Notes (Signed)
[facility]-EMERGENCY DEPT Provider Note   CSN: [id number] Arrival date & time: 07/22/20  1433     History Chief Complaint  Patient presents with  . Fall  . Ankle Pain    Drew [name] is a 24 y.o. male presents to the ED for evaluation of left ankle pain.  Sudden onset earlier today when he slipped and fell down some stairs.  States he was wearing socks and slipped and fell.  His ankle inverted.  Denies any other injuries.  Pain is constant, throbbing, worse with weightbearing, movement.  Has not been able to put any weight on it.  Not sure if it is swollen. No interventions.  Brought to the ED by EMS.  States he has broken his opposite ankle in the past and does not think the pain is as bad as that.  Denies distal tingling, loss of sensation.  He works in a [employer] and does not think he can go into work today.  HPI     Past Medical History:  Diagnosis Date  . Abscess   . Asthma     There are no problems to display for this patient.   Past Surgical History:  Procedure Laterality Date  . FRACTURE SURGERY         Family History  Adopted: Yes    Social History   Tobacco Use  . Smoking status: Light Tobacco Smoker    Types: Cigarettes  . Smokeless tobacco: Never Used  Vaping Use  . Vaping Use: Never used  Substance Use Topics  . Alcohol use: Not Currently  . Drug use: Yes    Types: Marijuana    Home Medications Prior to Admission medications   Medication Sig Start Date End Date Taking? Authorizing Provider  albuterol (VENTOLIN HFA) 108 (90 Base) MCG/ACT inhaler Inhale 2 puffs into the lungs every 6 (six) hours as needed for wheezing or shortness of breath.    [provider]  ibuprofen (ADVIL) 600 MG tablet Take 1 tablet (600 mg total) by mouth every 6 (six) hours as needed. Patient not taking: Reported on 11/22/2019 09/26/19   Derwood Kaplan, MD    Allergies    Dairy aid [lactase], Silver, and  Chocolate  Review of Systems   Review of Systems  Musculoskeletal: Positive for arthralgias.  All other systems reviewed and are negative.   Physical Exam Updated Vital Signs BP 128/74   Pulse 81   Temp 98.1 F (36.7 C)   Resp 17   SpO2 97%   Physical Exam Constitutional:      Appearance: He is well-developed.  HENT:     Head: Normocephalic.     Nose: Nose normal.  Eyes:     General: Lids are normal.  Cardiovascular:     Rate and Rhythm: Normal rate.  Pulmonary:     Effort: Pulmonary effort is normal. No respiratory distress.  Musculoskeletal:        General: Normal range of motion.     Cervical back: Normal range of motion.     Comments:  Ankle: TTP over anterior/inferior lateral malleoli, ATFL/ATL.  No TTP over medial ligament. Pain with plantarflexion, inversion.  No focal tenderness over the metatarsals, medial malleolus, Achilles, calcaneus.  Patient able to actively plantarflex, dorsiflex, wiggle toes.  No calf tenderness.  Negative squeeze test.  Sensation distally intact.  1+ DP pulse.  Skin over the joint normal and intact.  Neurological:     Mental Status: He is alert.  Psychiatric:        Behavior: Behavior normal.     ED Results / Procedures / Treatments   Labs (all labs ordered are listed, but only abnormal results are displayed) Labs Reviewed - No data to display  EKG None  Radiology DG Ankle Complete Left  Result Date: 07/22/2020 CLINICAL DATA:  Post fall. EXAM: LEFT ANKLE COMPLETE - 3+ VIEW COMPARISON:  None. FINDINGS: There is no evidence of fracture, dislocation, or joint effusion. There is no evidence of arthropathy or other focal bone abnormality. Soft tissues are unremarkable. IMPRESSION: Negative. Electronically Signed   By: Ted Mcalpine M.D.   On: 07/22/2020 14:54    Procedures Procedures (including critical care time)  Medications Ordered in ED Medications - No data to display  ED Course  I have reviewed the triage vital  signs and the nursing notes.  Pertinent labs & imaging results that were available during my care of the patient were reviewed by me and considered in my medical decision making (see chart for details).    MDM Rules/Calculators/A&P                           EMR, triage nursing notes reviewed to obtain more history and assist with MDM  Imaging ordered by triage RN  Imaging ordered: Ankle x-ray  DDx includes soft tissue injury of the ankle, likely some degree of mild sprain of the anterior tibiofibular or talofibular ligaments.  No tenderness over the medial aspect of the ankle, distal or more proximal areas to suggest any other injury, high ankle injury.  Sensation, strength and vascular status intact.  Interventions in the ED: Lace up ankle brace, crutches.  We will discharge with NSAIDs every 6 hours, elevation, ice, crutches, early range of motion exercises and return to weightbearing as tolerated.  Follow-up with PCP for persistent symptoms, return precautions given.  Patient in agreement with work-up and discharge plan.  Work note given. Final Clinical Impression(s) / ED Diagnoses Final diagnoses:  Sprain of left ankle, unspecified ligament, initial encounter  Fall, initial encounter    Rx / DC Orders ED Discharge Orders    None       Jerrell Mylar 07/22/20 1546    Benjiman Core, MD 07/22/20 2318

## 2020-07-22 NOTE — Discharge Instructions (Signed)
You were seen in the ED for ankle pain after a fall  X-rays of are normal  Your pain is likely from a soft tissue injury of your ankle like a sprain  Alternate ibuprofen 600 mg and acetaminophen [id number] mg every 6 hours for pain over the next 2 to 3 days.  Elevate the ankle.  Apply ice at least 4 times a day.  After 2 to 3 days of rest, start doing range of motion exercises of the ankle and return to weightbearing as tolerated.  Use your crutches as needed.  Follow-up with primary care doctor in the next 7 to 10 days if your pain is not improving  Return to the ED for sudden, constant severe pain of the ankle, severe swelling, distal loss of sensation, coolness or discoloration of the foot

## 2020-07-22 NOTE — Progress Notes (Signed)
Orthopedic Tech Progress Note Patient Details:  Drew [name] [birth year] [id number]  Ortho Devices Type of Ortho Device: Crutches, ASO Ortho Device/Splint Location: left Ortho Device/Splint Interventions: Application   Post Interventions Patient Tolerated: Well Instructions Provided: Care of device   [name] 07/22/2020, 3:57 PM

## 2020-07-22 NOTE — ED Triage Notes (Signed)
Per EMS, patient from home, c/o left ankle pain after fall downstairs. Denies neck and back pain.

## 2020-08-30 ENCOUNTER — Emergency Department (HOSPITAL_COMMUNITY)
Admission: EM | Admit: 2020-08-30 | Discharge: 2020-08-30 | Disposition: A | Discharge status: Home / Self Care | Payer: Self-pay | Attending: Emergency Medicine | Admitting: Emergency Medicine

## 2020-08-30 ENCOUNTER — Other Ambulatory Visit: Payer: Self-pay

## 2020-08-30 DIAGNOSIS — R070 Pain in throat: Secondary | ICD-10-CM | POA: Insufficient documentation

## 2020-08-30 DIAGNOSIS — H57813 Brow ptosis, bilateral: Secondary | ICD-10-CM | POA: Insufficient documentation

## 2020-08-30 DIAGNOSIS — Z5321 Procedure and treatment not carried out due to patient leaving prior to being seen by health care provider: Secondary | ICD-10-CM | POA: Insufficient documentation

## 2020-08-30 DIAGNOSIS — R0981 Nasal congestion: Secondary | ICD-10-CM | POA: Insufficient documentation

## 2020-08-30 DIAGNOSIS — R519 Headache, unspecified: Secondary | ICD-10-CM | POA: Insufficient documentation

## 2020-08-30 NOTE — ED Triage Notes (Signed)
Pt arrived via walk in, c/o bilateral eye swelling, denies any visual problems. States this started at 3am this morning. Denies any new products. Also states COVID exposure 1/1, started with sore throat, nasal congestion and headache after that.

## 2020-08-31 ENCOUNTER — Encounter (HOSPITAL_COMMUNITY): Payer: Self-pay | Admitting: Emergency Medicine

## 2020-08-31 ENCOUNTER — Emergency Department (HOSPITAL_COMMUNITY)
Admission: EM | Admit: 2020-08-31 | Discharge: 2020-08-31 | Disposition: A | Discharge status: Home / Self Care | Payer: Self-pay | Attending: Emergency Medicine | Admitting: Emergency Medicine

## 2020-08-31 ENCOUNTER — Other Ambulatory Visit: Payer: Self-pay

## 2020-08-31 DIAGNOSIS — Z5321 Procedure and treatment not carried out due to patient leaving prior to being seen by health care provider: Secondary | ICD-10-CM | POA: Insufficient documentation

## 2020-08-31 DIAGNOSIS — Z20822 Contact with and (suspected) exposure to covid-19: Secondary | ICD-10-CM | POA: Insufficient documentation

## 2020-08-31 NOTE — ED Triage Notes (Signed)
Per pt, states he has been around someone with covid-wants a test-was here yesterday but left due to wait-complaining of h/a

## 2020-09-14 ENCOUNTER — Encounter (HOSPITAL_COMMUNITY): Payer: Self-pay

## 2020-09-14 ENCOUNTER — Other Ambulatory Visit: Payer: Self-pay

## 2020-09-14 DIAGNOSIS — F17210 Nicotine dependence, cigarettes, uncomplicated: Secondary | ICD-10-CM | POA: Insufficient documentation

## 2020-09-14 DIAGNOSIS — J45909 Unspecified asthma, uncomplicated: Secondary | ICD-10-CM | POA: Insufficient documentation

## 2020-09-14 DIAGNOSIS — R1084 Generalized abdominal pain: Secondary | ICD-10-CM | POA: Insufficient documentation

## 2020-09-14 LAB — COMPREHENSIVE METABOLIC PANEL
ALT: 8 U/L (ref 0–44)
AST: 13 U/L — ABNORMAL LOW (ref 15–41)
Albumin: 4.4 g/dL (ref 3.5–5.0)
Alkaline Phosphatase: 48 U/L (ref 38–126)
Anion gap: 7 (ref 5–15)
BUN: 7 mg/dL (ref 6–20)
CO2: 29 mmol/L (ref 22–32)
Calcium: 9.6 mg/dL (ref 8.9–10.3)
Chloride: 105 mmol/L (ref 98–111)
Creatinine, Ser: 1 mg/dL (ref 0.61–1.24)
GFR, Estimated: >60 mL/min (ref >60)
Glucose, Bld: 98 mg/dL (ref 70–99)
Potassium: 4.5 mmol/L (ref 3.5–5.1)
Sodium: 141 mmol/L (ref 135–145)
Total Bilirubin: 1.3 mg/dL — ABNORMAL HIGH (ref 0.3–1.2)
Total Protein: 7.2 g/dL (ref 6.5–8.1)

## 2020-09-14 LAB — CBC
HCT: 46.7 % (ref 39.0–52.0)
Hemoglobin: 16.5 g/dL (ref 13.0–17.0)
MCH: 29.4 pg (ref 26.0–34.0)
MCHC: 35.3 g/dL (ref 30.0–36.0)
MCV: 83.1 fL (ref 80.0–100.0)
Platelets: 288 10*3/uL (ref 150–400)
RBC: 5.62 MIL/uL (ref 4.22–5.81)
RDW: 13.5 % (ref 11.5–15.5)
WBC: 8.6 10*3/uL (ref 4.0–10.5)
nRBC: 0 % (ref 0.0–0.2)

## 2020-09-14 LAB — LIPASE, BLOOD: Lipase: 31 U/L (ref 11–51)

## 2020-09-14 NOTE — ED Triage Notes (Signed)
Pt reports intermittent sharp left sided abdominal pain that began yesterday. Pt denies N/V/D.

## 2020-09-15 ENCOUNTER — Emergency Department (HOSPITAL_COMMUNITY)
Admission: EM | Admit: 2020-09-15 | Discharge: 2020-09-15 | Disposition: A | Discharge status: Home / Self Care | Payer: Self-pay | Attending: Emergency Medicine | Admitting: Emergency Medicine

## 2020-09-15 DIAGNOSIS — R1084 Generalized abdominal pain: Secondary | ICD-10-CM

## 2020-09-15 NOTE — ED Provider Notes (Signed)
[facility]-EMERGENCY DEPT Provider Note   CSN: [id number] Arrival date & time: 09/14/20  1834     History Chief Complaint  Patient presents with  . Abdominal Pain    Drew [name] is a 25 y.o. male.  25 y.o male with a PMH of Asthma presents to the ED with a chief complaint of abdominal pain x 2 days. Described at [facility] sharp without any radiation. No alleviating or exacerbating symptoms. Has not tried any medication for relieved in symptoms. No fever, on alcohol abuse, no blood in his stool, no nausea or vomiting. No other complaints.     The history is provided by the patient.  Abdominal Pain Pain location:  LUQ Pain quality: sharp   Pain radiates to:  Does not radiate Pain severity:  Mild Onset quality:  Gradual Duration:  2 days Timing:  Intermittent Progression:  Resolved Chronicity:  Recurrent Context: not alcohol use, not previous surgeries, not recent illness and not suspicious food intake   Relieved by:  Nothing Worsened by:  Nothing Ineffective treatments:  None tried Associated symptoms: no chest pain, no chills, no constipation, no cough, no diarrhea, no fever, no nausea, no shortness of breath and no vomiting   Risk factors: no alcohol abuse, has not had multiple surgeries, not obese and no recent hospitalization        Past Medical History:  Diagnosis Date  . Abscess   . Asthma     There are no problems to display for this patient.   Past Surgical History:  Procedure Laterality Date  . FRACTURE SURGERY         Family History  Adopted: Yes    Social History   Tobacco Use  . Smoking status: Light Tobacco Smoker    Types: Cigarettes  . Smokeless tobacco: Never Used  Vaping Use  . Vaping Use: Never used  Substance Use Topics  . Alcohol use: Not Currently  . Drug use: Yes    Types: Marijuana    Home Medications Prior to Admission medications   Medication Sig Start Date End Date Taking? Authorizing  Provider  albuterol (VENTOLIN HFA) 108 (90 Base) MCG/ACT inhaler Inhale 2 puffs into the lungs every 6 (six) hours as needed for wheezing or shortness of breath.    [provider]  ibuprofen (ADVIL) 600 MG tablet Take 1 tablet (600 mg total) by mouth every 6 (six) hours as needed. Patient not taking: Reported on 11/22/2019 09/26/19   Derwood Kaplan, MD    Allergies    Dairy aid [lactase], Silver, and Chocolate  Review of Systems   Review of Systems  Constitutional: Negative for chills and fever.  Respiratory: Negative for cough and shortness of breath.   Cardiovascular: Negative for chest pain.  Gastrointestinal: Positive for abdominal pain. Negative for constipation, diarrhea, nausea and vomiting.  Genitourinary: Negative for flank pain.  Musculoskeletal: Negative for myalgias.  Neurological: Negative for light-headedness and headaches.  All other systems reviewed and are negative.   Physical Exam Updated Vital Signs BP 125/76 (BP Location: Left Arm)   Pulse 60   Temp 98.1 F (36.7 C) (Oral)   Resp 16   Ht 5\' 9"  (1.753 m)   Wt 74.8 kg   SpO2 100%   BMI 24.37 kg/m   Physical Exam Vitals and nursing note reviewed.  Constitutional:      Appearance: He is well-developed.  HENT:     Head: Normocephalic and atraumatic.  Cardiovascular:     Rate and  Rhythm: Normal rate.  Pulmonary:     Effort: Pulmonary effort is normal.     Breath sounds: No wheezing, rhonchi or rales.  Abdominal:     General: Abdomen is flat. Bowel sounds are normal.     Palpations: Abdomen is soft.     Tenderness: There is no abdominal tenderness. There is no right CVA tenderness or left CVA tenderness.     Hernia: No hernia is present.  Skin:    General: Skin is warm and dry.  Neurological:     Mental Status: He is alert and oriented to person, place, and time.     ED Results / Procedures / Treatments   Labs (all labs ordered are listed, but only abnormal results are displayed) Labs  Reviewed  COMPREHENSIVE METABOLIC PANEL - Abnormal; Notable for the following components:      Result Value   AST 13 (*)    Total Bilirubin 1.3 (*)    All other components within normal limits  LIPASE, BLOOD  CBC  URINALYSIS, ROUTINE W REFLEX MICROSCOPIC    EKG None  Radiology No results found.  Procedures Procedures (including critical care time)  Medications Ordered in ED Medications - No data to display  ED Course  I have reviewed the triage vital signs and the nursing notes.  Pertinent labs & imaging results that were available during my care of the patient were reviewed by me and considered in my medical decision making (see chart for details).    MDM Rules/Calculators/A&P    Patient with no pertinent PMH with generalized abdominal pain, has not taken any medication for relieve in symptoms.  There is no exacerbating or alleviating factors.  He does report pain has subsided while his visit in the ED.  He has had no fevers, no blood in his stool, no nausea or vomiting.  Does endorse THC use on a recreational level.  No history of alcohol abuse.  No prior surgeries.  During evaluation patient is nontoxic, non-ill-appearing, vitals are within normal limits, he is afebrile, no URI symptoms, no tachycardia and normotensive.  [name] is soft, mildly tender to palpation in generalized area, he reports "I am just sleepy ".  No CVA tenderness, negative Murphy's sign, no pain at McBurney's point.  Bowel sounds are present.  No chest pain or notes of breath.  Interpretation of his labs reveal a CMP without any electrolyte abnormalities, creatinine levels within normal limits.  LFTs are within normal limits.  CBC without a leukocytosis, hemoglobin stable.  Lipase level was within normal limits.  UA was not collected as patient has been here for 12 hours, we discussed need for this test at this time, he reveals no urinary symptoms on today's visit, no concerns for sexually transmitted  infections.  Low suspicion for cholecystitis, appendicitis, diverticulitis, other acute pathology at this time.   Patient is agreeable to discharge home at this time, we discussed further follow-up with PCP, provided with the [facility] health and wellness clinic.  He understands and agrees with management, return precautions discussed at length.   Portions of this note were generated with Scientist, clinical (histocompatibility and immunogenetics). Dictation errors may occur despite best attempts at proofreading.  Final Clinical Impression(s) / ED Diagnoses Final diagnoses:  Generalized abdominal pain    Rx / DC Orders ED Discharge Orders    None       Claude Manges, PA-C 09/15/20 9562    Paula Libra, MD 09/15/20 2241

## 2020-09-15 NOTE — Discharge Instructions (Signed)
Your laboratory results were within normals.  We discussed treatment at home with ibuprofen or Tylenol.  If you experience any nausea, vomiting, worsening symptoms please return to the emergency department.  The number to the [facility] and Wellness clinic is attached to your chart, please schedule an appointment to establish primary care.

## 2020-10-26 ENCOUNTER — Emergency Department (HOSPITAL_COMMUNITY): Payer: Self-pay

## 2020-10-26 ENCOUNTER — Encounter (HOSPITAL_COMMUNITY): Payer: Self-pay | Admitting: *Deleted

## 2020-10-26 ENCOUNTER — Other Ambulatory Visit: Payer: Self-pay

## 2020-10-26 ENCOUNTER — Emergency Department (HOSPITAL_COMMUNITY)
Admission: EM | Admit: 2020-10-26 | Discharge: 2020-10-26 | Disposition: A | Discharge status: Home / Self Care | Payer: Self-pay | Attending: Emergency Medicine | Admitting: Emergency Medicine

## 2020-10-26 DIAGNOSIS — J45909 Unspecified asthma, uncomplicated: Secondary | ICD-10-CM | POA: Insufficient documentation

## 2020-10-26 DIAGNOSIS — F17210 Nicotine dependence, cigarettes, uncomplicated: Secondary | ICD-10-CM | POA: Insufficient documentation

## 2020-10-26 DIAGNOSIS — W2209XA Striking against other stationary object, initial encounter: Secondary | ICD-10-CM | POA: Insufficient documentation

## 2020-10-26 DIAGNOSIS — S60222A Contusion of left hand, initial encounter: Secondary | ICD-10-CM

## 2020-10-26 MED ORDER — IBUPROFEN 800 MG PO TABS: 800.0000 mg | ORAL_TABLET | Freq: Once | ORAL | Status: DC

## 2020-10-26 NOTE — ED Provider Notes (Signed)
[facility] EMERGENCY DEPARTMENT Provider Note  CSN: [id number] Arrival date & time: 10/26/20 1648    History Chief Complaint  Patient presents with  . Hand Injury    HPI  Drew [name] is a 25 y.o. male with no significant PMH reports he punched a wall with his left hand while angry earlier today. He has had moderate pain to L 5th MCP area since then, worse with movement. No other injuries. Did not punch a person or sustain a 'fight bite'. Patient is right handed   Past Medical History:  Diagnosis Date  . Abscess   . Asthma     Past Surgical History:  Procedure Laterality Date  . FRACTURE SURGERY      Family History  Adopted: Yes    Social History   Tobacco Use  . Smoking status: Light Tobacco Smoker    Types: Cigarettes  . Smokeless tobacco: Never Used  Vaping Use  . Vaping Use: Never used  Substance Use Topics  . Alcohol use: Not Currently  . Drug use: Yes    Types: Marijuana     Home Medications Prior to Admission medications   Medication Sig Start Date End Date Taking? Authorizing Provider  albuterol (VENTOLIN HFA) 108 (90 Base) MCG/ACT inhaler Inhale 2 puffs into the lungs every 6 (six) hours as needed for wheezing or shortness of breath.    [provider]  ibuprofen (ADVIL) 600 MG tablet Take 1 tablet (600 mg total) by mouth every 6 (six) hours as needed. Patient not taking: Reported on 11/22/2019 09/26/19   Derwood Kaplan, MD     Allergies    Dairy aid [lactase], Silver, and Chocolate   Review of Systems   Review of Systems Unable to assess due to mental status.    Physical Exam BP 139/79 (BP Location: Right Arm)   Pulse 100   Temp 98.8 F (37.1 C) (Oral)   Resp 18   SpO2 99%   Physical Exam Vitals and nursing note reviewed.  HENT:     Head: Normocephalic.     Nose: Nose normal.  Eyes:     Extraocular Movements: Extraocular movements intact.  Pulmonary:     Effort: Pulmonary effort is normal.   Musculoskeletal:        General: Normal range of motion.     Cervical back: Neck supple.     Comments: Superficial abrasions to dorsal L hand over multiple MCP joints, bruising swelling and tenderness over the L 5th MCP. Distally NVI  Skin:    Findings: No rash (on exposed skin).  Neurological:     Mental Status: He is alert and oriented to person, place, and time.  Psychiatric:        Mood and Affect: Mood normal.      ED Results / Procedures / Treatments   Labs (all labs ordered are listed, but only abnormal results are displayed) Labs Reviewed - No data to display  EKG None  Radiology DG Hand Complete Left  Result Date: 10/26/2020 CLINICAL DATA:  Punched a wall pain at base of pinky EXAM: LEFT HAND - COMPLETE 3+ VIEW COMPARISON:  None. FINDINGS: There is no evidence of fracture or dislocation. There is no evidence of arthropathy or other focal bone abnormality. Soft tissues are unremarkable. IMPRESSION: Negative. Electronically Signed   By: Jasmine Pang M.D.   On: 10/26/2020 17:59    Procedures Procedures  Medications Ordered in the ED Medications  ibuprofen (ADVIL) tablet 800 mg (has no administration  in time range)     MDM Rules/Calculators/A&P MDM Xray pending for evaluation of bony injury.   ED Course  I have reviewed the triage vital signs and the nursing notes.  Pertinent labs & imaging results that were available during my care of the patient were reviewed by me and considered in my medical decision making (see chart for details).  Clinical Course as of 10/26/20 1807  Thu Oct 26, 2020  1802 Xray images and results reviewed, neg for fracture. Will ask RN to dress wounds and place ACE wrap for comfort. Motrin/APAP for pain.  [CS]    Clinical Course User Index [CS] Pollyann Savoy, MD    Final Clinical Impression(s) / ED Diagnoses Final diagnoses:  Contusion of left hand, initial encounter    Rx / DC Orders ED Discharge Orders    None        Pollyann Savoy, MD 10/26/20 1807

## 2020-10-26 NOTE — ED Triage Notes (Signed)
Pt complains of hand pain since punching a wall today. Pt denies wrist pain.

## 2020-11-25 ENCOUNTER — Emergency Department (HOSPITAL_COMMUNITY)
Admission: EM | Admit: 2020-11-25 | Discharge: 2020-11-25 | Disposition: A | Discharge status: Home / Self Care | Payer: Self-pay | Attending: Emergency Medicine | Admitting: Emergency Medicine

## 2020-11-25 ENCOUNTER — Encounter (HOSPITAL_COMMUNITY): Payer: Self-pay | Admitting: Emergency Medicine

## 2020-11-25 ENCOUNTER — Other Ambulatory Visit: Payer: Self-pay

## 2020-11-25 ENCOUNTER — Emergency Department (HOSPITAL_COMMUNITY): Payer: Self-pay

## 2020-11-25 DIAGNOSIS — R079 Chest pain, unspecified: Secondary | ICD-10-CM | POA: Insufficient documentation

## 2020-11-25 DIAGNOSIS — F17210 Nicotine dependence, cigarettes, uncomplicated: Secondary | ICD-10-CM | POA: Insufficient documentation

## 2020-11-25 DIAGNOSIS — J45909 Unspecified asthma, uncomplicated: Secondary | ICD-10-CM | POA: Insufficient documentation

## 2020-11-25 LAB — CBC
HCT: 49.5 % (ref 39.0–52.0)
Hemoglobin: 18.3 g/dL — ABNORMAL HIGH (ref 13.0–17.0)
MCH: 30 pg (ref 26.0–34.0)
MCHC: 37 g/dL — ABNORMAL HIGH (ref 30.0–36.0)
MCV: 81.1 fL (ref 80.0–100.0)
Platelets: 268 10*3/uL (ref 150–400)
RBC: 6.1 MIL/uL — ABNORMAL HIGH (ref 4.22–5.81)
RDW: 14.2 % (ref 11.5–15.5)
WBC: 12.7 10*3/uL — ABNORMAL HIGH (ref 4.0–10.5)
nRBC: 0 % (ref 0.0–0.2)

## 2020-11-25 LAB — BASIC METABOLIC PANEL
Anion gap: 10 (ref 5–15)
BUN: 20 mg/dL (ref 6–20)
CO2: 26 mmol/L (ref 22–32)
Calcium: 9.6 mg/dL (ref 8.9–10.3)
Chloride: 102 mmol/L (ref 98–111)
Creatinine, Ser: 1.15 mg/dL (ref 0.61–1.24)
GFR, Estimated: >60 mL/min (ref >60)
Glucose, Bld: 107 mg/dL — ABNORMAL HIGH (ref 70–99)
Potassium: 3.9 mmol/L (ref 3.5–5.1)
Sodium: 138 mmol/L (ref 135–145)

## 2020-11-25 LAB — TROPONIN I (HIGH SENSITIVITY): Troponin I (High Sensitivity): 3 ng/L (ref <18)

## 2020-11-25 MED ORDER — ACETAMINOPHEN 500 MG PO TABS
1000.0000 mg | ORAL_TABLET | Freq: Once | ORAL | Status: AC
  Administered 2020-11-25: 1000 mg via ORAL
  Filled 2020-11-25: qty 2

## 2020-11-25 NOTE — ED Triage Notes (Signed)
Patient complaining of mid chest pain and sob. Patient has scent of marijuana.

## 2020-11-25 NOTE — Discharge Instructions (Signed)
I would schedule an appointment with wellness clinic.  They are closed on weekends but can call Monday to get this arranged. Return to ED for new concerns.

## 2020-11-25 NOTE — ED Provider Notes (Signed)
[facility]-EMERGENCY DEPT Provider Note   CSN: [id number] Arrival date & time: 11/25/20  9563     History Chief Complaint  Patient presents with  . Chest Pain  . Shortness of Breath    Drew [name] is a 25 y.o. male.  The history is provided by the patient and medical records.    25 year old male presenting to the ED with chest pain.  Has been having issues for the past 2 months.  States randomly he will get sudden onset of sharp, pinching pain in the center of his chest.  This lasts for 3 to 5 seconds and then resolve spontaneously.  This is not associated with exertion or eating.  States after the pain he occasionally will have some shortness of breath but that too is short-lived and self resolves.  He has not had any cough or fever.  He does vape, but does not smoke cigarettes.  He has no known cardiac disease.  Past Medical History:  Diagnosis Date  . Abscess   . Asthma     There are no problems to display for this patient.   Past Surgical History:  Procedure Laterality Date  . FRACTURE SURGERY         Family History  Adopted: Yes    Social History   Tobacco Use  . Smoking status: Light Tobacco Smoker    Types: Cigarettes  . Smokeless tobacco: Never Used  Vaping Use  . Vaping Use: Never used  Substance Use Topics  . Alcohol use: Not Currently  . Drug use: Yes    Types: Marijuana    Home Medications Prior to Admission medications   Not on File    Allergies    Dairy aid [lactase], Silver, and Chocolate  Review of Systems   Review of Systems  Cardiovascular: Positive for chest pain.  All other systems reviewed and are negative.   Physical Exam Updated Vital Signs BP (!) 131/107 (BP Location: Right Arm)   Pulse 96   Temp 98 F (36.7 C) (Oral)   Resp 18   Ht 5\' 7"  (1.702 m)   Wt 79.8 kg   SpO2 99%   BMI 27.57 kg/m   Physical Exam Vitals and nursing note reviewed.  Constitutional:      Appearance: He is  well-developed.     Comments: Talking on phone, NAD  HENT:     Head: Normocephalic and atraumatic.  Eyes:     Conjunctiva/sclera: Conjunctivae normal.     Pupils: Pupils are equal, round, and reactive to light.  Cardiovascular:     Rate and Rhythm: Normal rate and regular rhythm.     Heart sounds: Normal heart sounds.  Pulmonary:     Effort: Pulmonary effort is normal.     Breath sounds: Normal breath sounds. No decreased breath sounds or wheezing.  Abdominal:     General: Bowel sounds are normal.     Palpations: Abdomen is soft.  Musculoskeletal:        General: Normal range of motion.     Cervical back: Normal range of motion.  Skin:    General: Skin is warm and dry.  Neurological:     Mental Status: He is alert and oriented to person, place, and time.     ED Results / Procedures / Treatments   Labs (all labs ordered are listed, but only abnormal results are displayed) Labs Reviewed  BASIC METABOLIC PANEL - Abnormal; Notable for the following components:  Result Value   Glucose, Bld 107 (*)    All other components within normal limits  CBC - Abnormal; Notable for the following components:   WBC 12.7 (*)    RBC 6.10 (*)    Hemoglobin 18.3 (*)    MCHC 37.0 (*)    All other components within normal limits  TROPONIN I (HIGH SENSITIVITY)    EKG None  Radiology DG Chest 2 View  Result Date: 11/25/2020 CLINICAL DATA:  Chest pain EXAM: CHEST - 2 VIEW COMPARISON:  10/31/2018 FINDINGS: The heart size and mediastinal contours are within normal limits. Both lungs are clear. The visualized skeletal structures are unremarkable. IMPRESSION: Normal study. Electronically Signed   By: Charlett Nose M.D.   On: 11/25/2020 02:57    Procedures Procedures   Medications Ordered in ED Medications  acetaminophen (TYLENOL) tablet 1,000 mg (1,000 mg Oral Given 11/25/20 0357)    ED Course  I have reviewed the triage vital signs and the nursing notes.  Pertinent labs & imaging  results that were available during my care of the patient were reviewed by me and considered in my medical decision making (see chart for details).    MDM Rules/Calculators/A&P  25 year old male here with chest pain.  Intermittent over the past 2 months, lasting anywhere from 3 to 5 seconds.  No exertional component.  He does vape but does not smoke cigarettes.  He has no known cardiac disease.  EKG is nonischemic.  Labs are reassuring, troponin negative.  Chest x-ray is clear.  He is young without any noted risk factors and symptoms seem very atypical.  I have low suspicion for ACS, PE, dissection, acute cardiac event.  Feel he is stable for discharge with outpatient follow-up.  Does not currently have a PCP nor health insurance and will refer to wellness clinic for follow-up.  He may return here for new concerns.  Final Clinical Impression(s) / ED Diagnoses Final diagnoses:  Chest pain in adult    Rx / DC Orders ED Discharge Orders    None       Garlon Hatchet, PA-C 11/25/20 0405    Molpus, Jonny Ruiz, MD 11/25/20 [phone]

## 2020-12-03 IMAGING — CT CT CERVICAL SPINE W/O CM
3 of 4 series · 9 of 33 positions shown, 11 images · non-contrast
Comparison: None.

CLINICAL DATA: Fall hitting back of head

EXAM:
CT HEAD WITHOUT CONTRAST
TECHNIQUE: Contiguous axial images were obtained from the base of the skull
through the vertex without intravenous contrast.

[Series 6: orthogonal bone · axial · 0.23mm/px · z∈[-200,-200]mm · 1 of 120 slices shown, 2 images]
[im 60/120  soft-tissue]
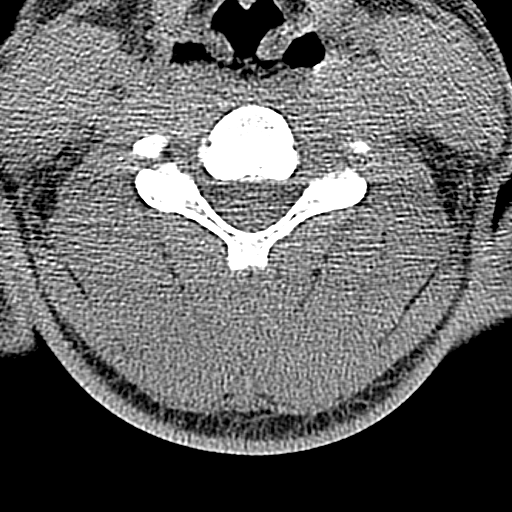
[im 60/120  bone]
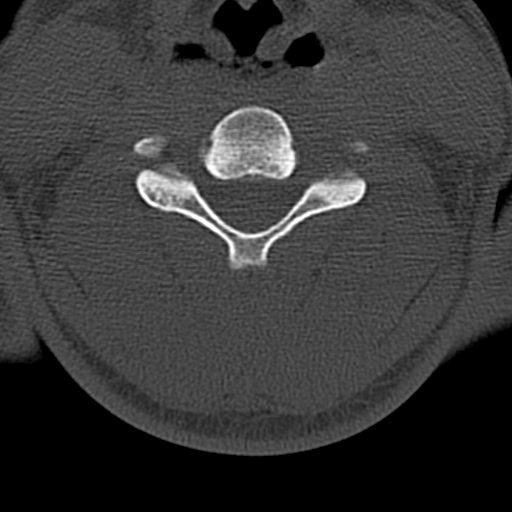

[Series 7: coronal bone · coronal · 0.23mm/px · 3 of 61 slices shown]
[im 13/61  bone]
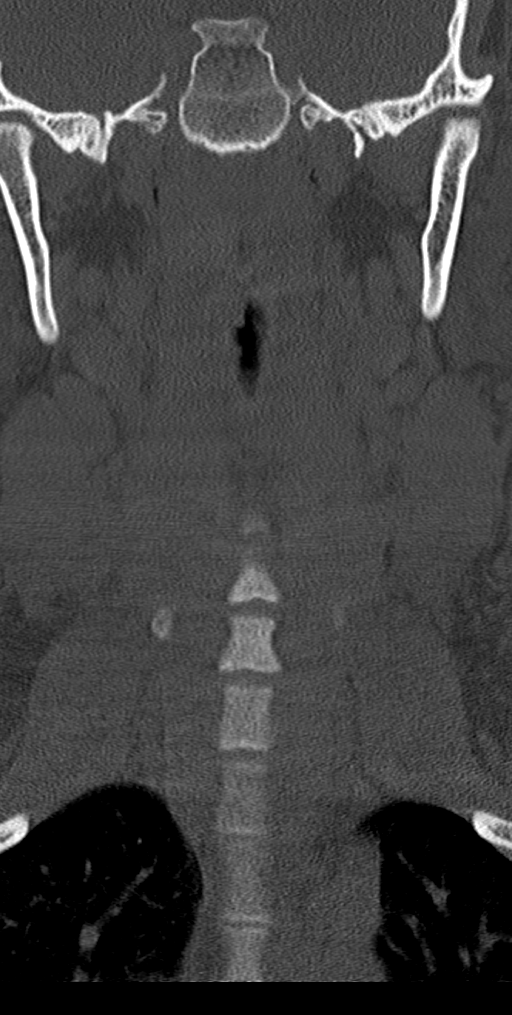
[im 25/61  bone]
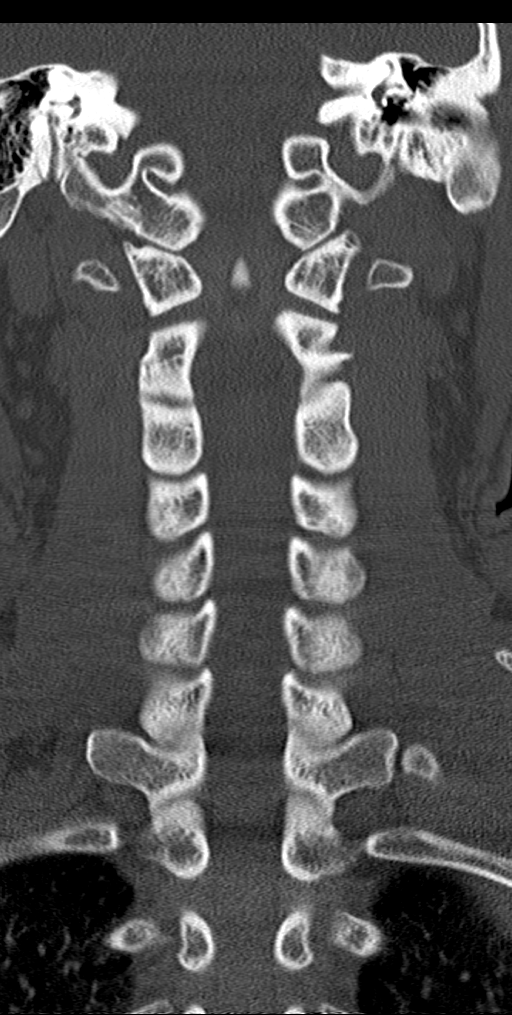
[im 37/61  bone]
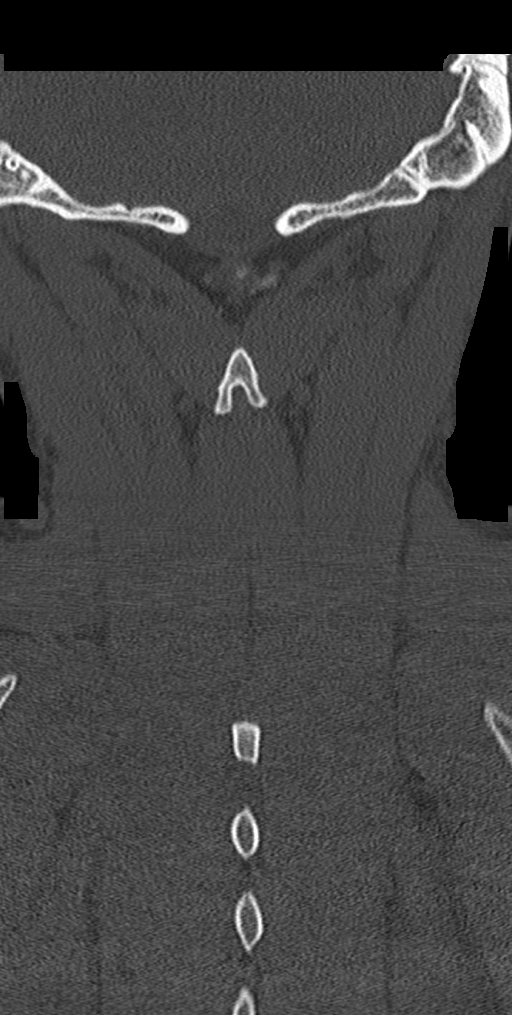

[Series 8: sagittal bone · sagittal · 0.23mm/px · 5 of 61 slices shown, 6 images]
[im 21/61  bone]
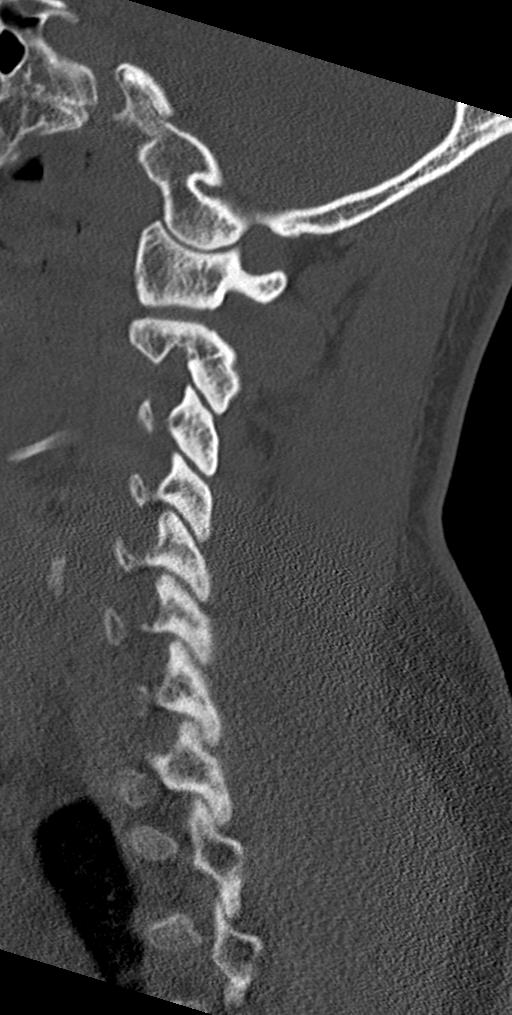
[im 26/61  bone]
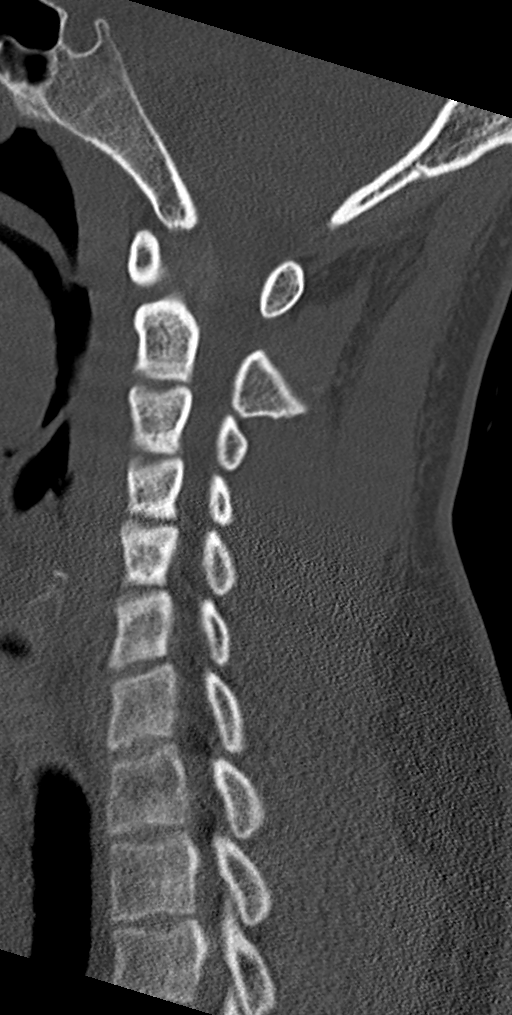
[im 31/61  soft-tissue]
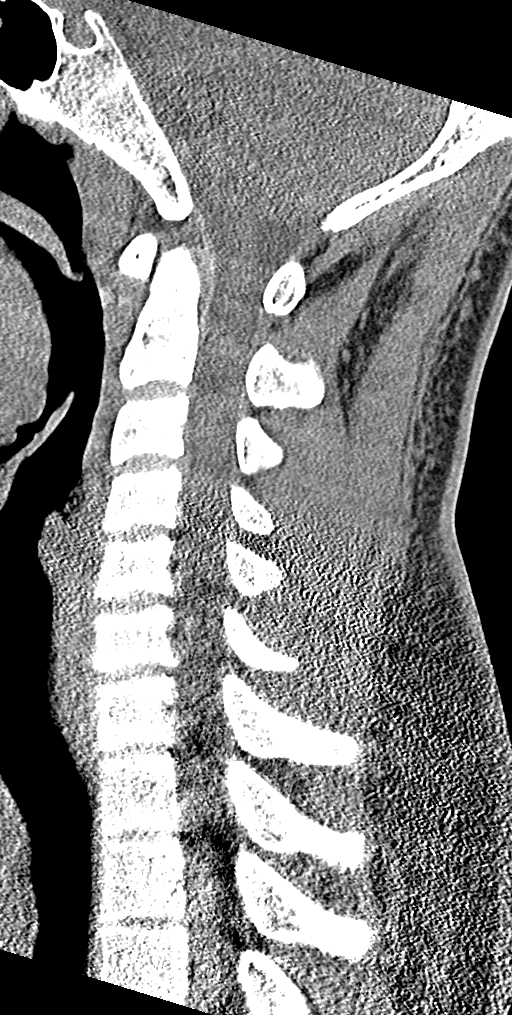
[im 31/61  bone]
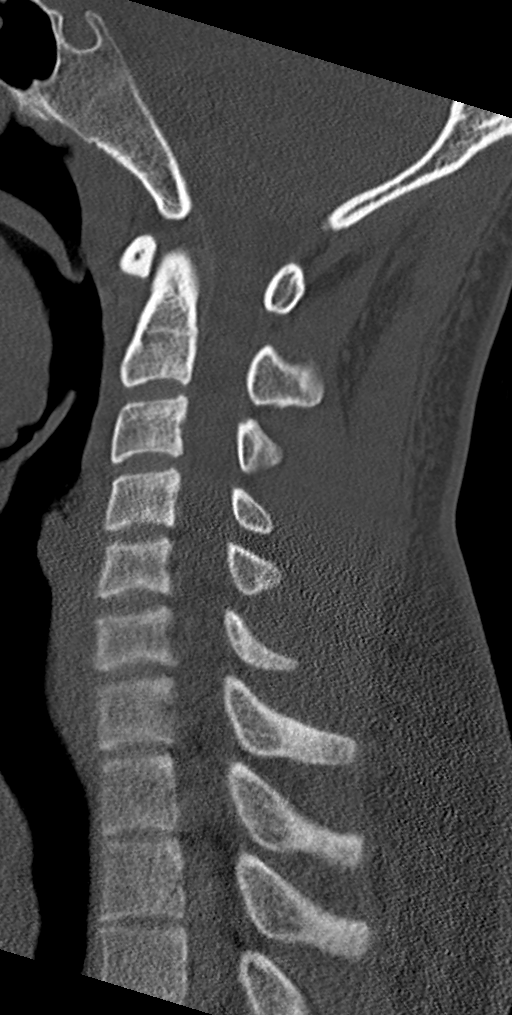
[im 36/61  bone]
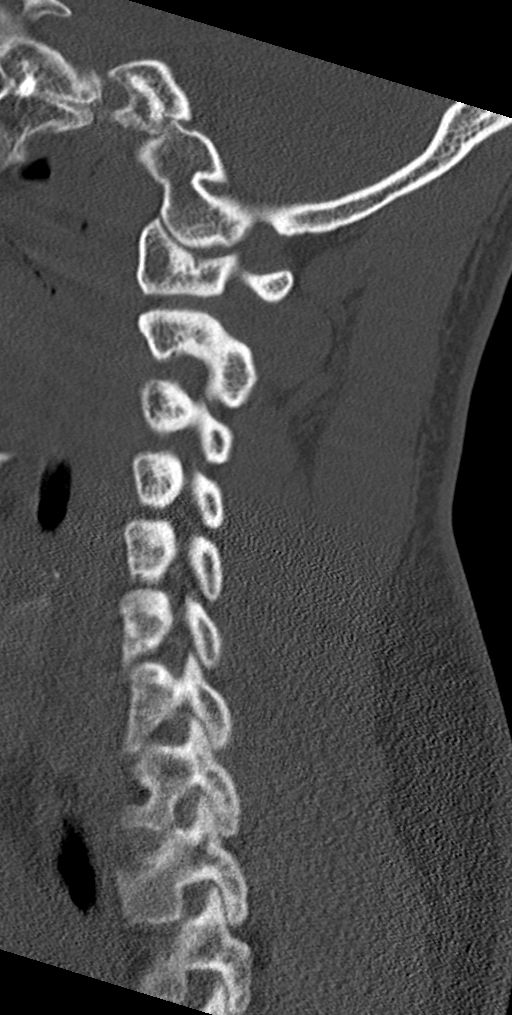
[im 41/61  bone]
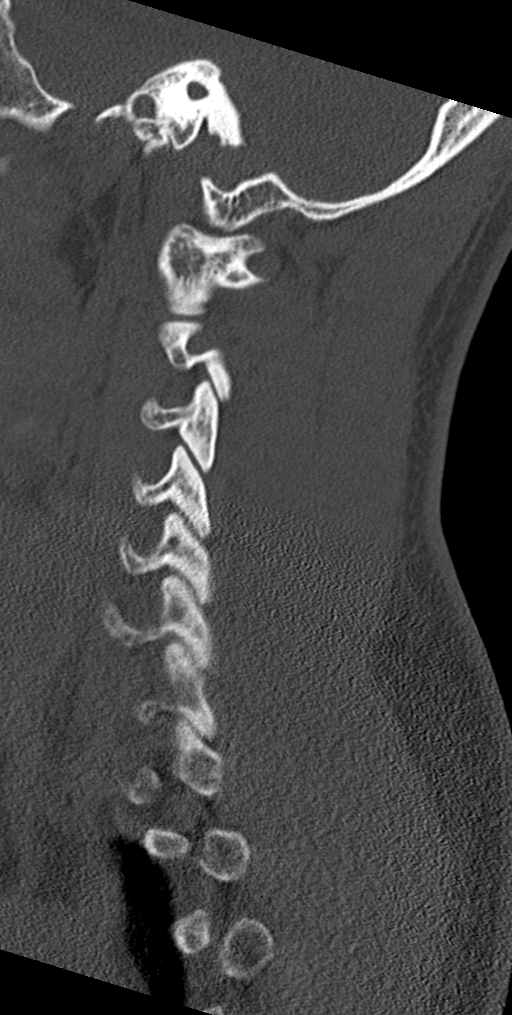

[9 of 33 positions shown; findings below may reference images not displayed]

FINDINGS: Brain: No evidence of acute territorial infarction, hemorrhage,
hydrocephalus,extra-axial collection or mass lesion/mass effect.
Normal gray-white differentiation. Ventricles are normal in size and
contour.

Vascular: No hyperdense vessel or unexpected calcification.

Skull: The skull is intact. No fracture or focal lesion identified.

Sinuses/Orbits: The visualized paranasal sinuses and mastoid air
cells are clear. The orbits and globes intact.

Other: None

Cervical spine:

Alignment: Physiologic

Skull base and vertebrae: Visualized skull base is intact. No
atlanto-occipital dissociation. The vertebral body heights are well
maintained. No fracture or pathologic osseous lesion seen.

Soft tissues and spinal canal: The visualized paraspinal soft
tissues are unremarkable. No prevertebral soft tissue swelling is
seen. The spinal canal is grossly unremarkable, no large epidural
collection or significant canal narrowing.

Disc levels:  No significant canal or neural foraminal narrowing.

Upper chest: The lung apices are clear. Thoracic inlet is within
normal limits.

Other: None
IMPRESSION: No acute intracranial abnormality.

No acute fracture or malalignment of the spine.

## 2020-12-10 ENCOUNTER — Encounter (HOSPITAL_COMMUNITY): Payer: Self-pay | Admitting: Emergency Medicine

## 2020-12-10 ENCOUNTER — Emergency Department (HOSPITAL_COMMUNITY)
Admission: EM | Admit: 2020-12-10 | Discharge: 2020-12-10 | Disposition: A | Discharge status: Home / Self Care | Payer: Self-pay | Attending: Emergency Medicine | Admitting: Emergency Medicine

## 2020-12-10 ENCOUNTER — Other Ambulatory Visit: Payer: Self-pay

## 2020-12-10 DIAGNOSIS — F17210 Nicotine dependence, cigarettes, uncomplicated: Secondary | ICD-10-CM | POA: Insufficient documentation

## 2020-12-10 DIAGNOSIS — Z20822 Contact with and (suspected) exposure to covid-19: Secondary | ICD-10-CM | POA: Insufficient documentation

## 2020-12-10 DIAGNOSIS — J45909 Unspecified asthma, uncomplicated: Secondary | ICD-10-CM | POA: Insufficient documentation

## 2020-12-10 DIAGNOSIS — R1110 Vomiting, unspecified: Secondary | ICD-10-CM

## 2020-12-10 DIAGNOSIS — R5381 Other malaise: Secondary | ICD-10-CM | POA: Insufficient documentation

## 2020-12-10 DIAGNOSIS — R6883 Chills (without fever): Secondary | ICD-10-CM | POA: Insufficient documentation

## 2020-12-10 DIAGNOSIS — R112 Nausea with vomiting, unspecified: Secondary | ICD-10-CM | POA: Insufficient documentation

## 2020-12-10 LAB — URINALYSIS, ROUTINE W REFLEX MICROSCOPIC
Bilirubin Urine: NEGATIVE
Glucose, UA: NEGATIVE mg/dL
Hgb urine dipstick: NEGATIVE
Ketones, ur: NEGATIVE mg/dL
Leukocytes,Ua: NEGATIVE
Nitrite: NEGATIVE
Protein, ur: NEGATIVE mg/dL
Specific Gravity, Urine: 1.009 (ref 1.005–1.030)
pH: 9 — ABNORMAL HIGH (ref 5.0–8.0)

## 2020-12-10 LAB — COMPREHENSIVE METABOLIC PANEL
ALT: 7 U/L (ref 0–44)
AST: 12 U/L — ABNORMAL LOW (ref 15–41)
Albumin: 4.1 g/dL (ref 3.5–5.0)
Alkaline Phosphatase: 50 U/L (ref 38–126)
Anion gap: 8 (ref 5–15)
BUN: 7 mg/dL (ref 6–20)
CO2: 30 mmol/L (ref 22–32)
Calcium: 9.2 mg/dL (ref 8.9–10.3)
Chloride: 103 mmol/L (ref 98–111)
Creatinine, Ser: 0.97 mg/dL (ref 0.61–1.24)
GFR, Estimated: >60 mL/min (ref >60)
Glucose, Bld: 124 mg/dL — ABNORMAL HIGH (ref 70–99)
Potassium: 4.2 mmol/L (ref 3.5–5.1)
Sodium: 141 mmol/L (ref 135–145)
Total Bilirubin: 0.8 mg/dL (ref 0.3–1.2)
Total Protein: 6.9 g/dL (ref 6.5–8.1)

## 2020-12-10 LAB — CBC
HCT: 44.8 % (ref 39.0–52.0)
Hemoglobin: 16.2 g/dL (ref 13.0–17.0)
MCH: 29.7 pg (ref 26.0–34.0)
MCHC: 36.2 g/dL — ABNORMAL HIGH (ref 30.0–36.0)
MCV: 82.1 fL (ref 80.0–100.0)
Platelets: 252 10*3/uL (ref 150–400)
RBC: 5.46 MIL/uL (ref 4.22–5.81)
RDW: 13.9 % (ref 11.5–15.5)
WBC: 7.5 10*3/uL (ref 4.0–10.5)
nRBC: 0 % (ref 0.0–0.2)

## 2020-12-10 LAB — LIPASE, BLOOD: Lipase: 37 U/L (ref 11–51)

## 2020-12-10 LAB — RESP PANEL BY RT-PCR (FLU A&B, COVID) ARPGX2
Influenza A by PCR: NEGATIVE
Influenza B by PCR: NEGATIVE
SARS Coronavirus 2 by RT PCR: NEGATIVE

## 2020-12-10 MED ORDER — OSELTAMIVIR PHOSPHATE 75 MG PO CAPS: 75.0000 mg | ORAL_CAPSULE | Freq: Two times a day (BID) | ORAL | 0 refills | Status: AC

## 2020-12-10 MED ORDER — ONDANSETRON HCL 4 MG PO TABS: 4.0000 mg | ORAL_TABLET | Freq: Four times a day (QID) | ORAL | 0 refills | Status: AC

## 2020-12-10 MED ORDER — ONDANSETRON 4 MG PO TBDP
4.0000 mg | ORAL_TABLET | Freq: Once | ORAL | Status: AC
  Administered 2020-12-10: 4 mg via ORAL
  Filled 2020-12-10: qty 1

## 2020-12-10 NOTE — ED Notes (Signed)
Per PA-C pt agreeable to blood work at this time.

## 2020-12-10 NOTE — ED Notes (Signed)
Pt ambulatory to the bathroom at this time.

## 2020-12-10 NOTE — ED Notes (Signed)
PA-C at bedside to evaluate.  

## 2020-12-10 NOTE — ED Notes (Signed)
Pt placed on continuous cardiac, BP, and O2 monitoring 

## 2020-12-10 NOTE — ED Notes (Signed)
PA-C Cortni Couture notified that this nurse entered the pt room to collect urine sample and start IV for lab work. The pt stated "Can I ask what this is for? I'm just here because I threw up at work this morning, they sent me home early and now I need a work note."

## 2020-12-10 NOTE — ED Triage Notes (Signed)
Patient here from work reporting n/v that started at 6:30 this morning.

## 2020-12-10 NOTE — Discharge Instructions (Addendum)
You were given a prescription for zofran to help with your nausea. Please take as directed.  Your Covid and flu test are pending at the time of discharge, if positive I will contact you with the results.  If negative you will not be contacted.  If positive for influenza, you can fill the prescription for Tamiflu which can help reduce the length of your symptoms.  If your Covid test is positive, You should be isolated for at least 5 days since the onset of your symptoms AND >72 hours after symptoms resolution (absence of fever without the use of fever reducing medicaiton and improvement in respiratory symptoms), whichever is longer   Please follow up with your primary doctor within the next 5-7 days.  If you do not have a primary care provider, information for a healthcare clinic has been provided for you to make arrangements for follow up care. Please return to the ER sooner if you have any new or worsening symptoms, or if you have any of the following symptoms:  Abdominal pain that does not go away.  You have a fever.  You keep throwing up (vomiting).  The pain is felt only in portions of the abdomen. Pain in the right side could possibly be appendicitis. In an adult, pain in the left lower portion of the abdomen could be colitis or diverticulitis.  You pass bloody or black tarry stools.  There is bright red blood in the stool.  The constipation stays for more than 4 days.  There is belly (abdominal) or rectal pain.  You do not seem to be getting better.  You have any questions or concerns.

## 2020-12-10 NOTE — ED Provider Notes (Signed)
[facility]-EMERGENCY DEPT Provider Note   CSN: [id number] Arrival date & time: 12/10/20  1137     History Chief Complaint  Patient presents with  . Nausea  . Emesis    Drew [name] is a 25 y.o. male.  HPI   25 year old male with a history of abscess, asthma, who presents to the emergency department today for evaluation of vomiting.  Patient states he woke up this morning and felt general malaise, he went to work and had an episode of vomiting.  They sent him home from work and told him he needed to be evaluated.  He reports no other associated symptoms including no documented fevers, abdominal pain, diarrhea, urinary symptoms, cough or other URI symptoms.  Denies any sick contacts.  Past Medical History:  Diagnosis Date  . Abscess   . Asthma     There are no problems to display for this patient.   Past Surgical History:  Procedure Laterality Date  . FRACTURE SURGERY         Family History  Adopted: Yes    Social History   Tobacco Use  . Smoking status: Light Tobacco Smoker    Types: Cigarettes  . Smokeless tobacco: Never Used  Vaping Use  . Vaping Use: Never used  Substance Use Topics  . Alcohol use: Not Currently  . Drug use: Yes    Types: Marijuana    Home Medications Prior to Admission medications   Medication Sig Start Date End Date Taking? Authorizing Provider  ondansetron (ZOFRAN) 4 MG tablet Take 1 tablet (4 mg total) by mouth every 6 (six) hours. 12/10/20  Yes Neveen Daponte S, PA-C  oseltamivir (TAMIFLU) 75 MG capsule Take 1 capsule (75 mg total) by mouth every 12 (twelve) hours. 12/10/20  Yes Antolin Belsito S, PA-C    Allergies    Dairy aid [lactase], Silver, Chocolate, and Shellfish allergy  Review of Systems   Review of Systems  Constitutional: Positive for chills. Negative for fever.  HENT: Negative for ear pain and sore throat.   Eyes: Negative for pain and visual disturbance.  Respiratory: Negative for  cough and shortness of breath.   Cardiovascular: Negative for chest pain and palpitations.  Gastrointestinal: Positive for nausea and vomiting. Negative for abdominal pain, constipation and diarrhea.  Genitourinary: Negative for dysuria and hematuria.  Musculoskeletal: Negative for back pain.  Skin: Negative for rash.  Neurological: Negative for headaches.  All other systems reviewed and are negative.   Physical Exam Updated Vital Signs BP 121/85 (BP Location: Right Arm)   Pulse 78   Temp 98.4 F (36.9 C) (Oral)   Resp 16   Ht 5\' 7"  (1.702 m)   Wt 75.8 kg   SpO2 100%   BMI 26.16 kg/m   Physical Exam Vitals and nursing note reviewed.  Constitutional:      Appearance: He is well-developed.  HENT:     Head: Normocephalic and atraumatic.  Eyes:     Conjunctiva/sclera: Conjunctivae normal.  Cardiovascular:     Rate and Rhythm: Normal rate and regular rhythm.     Heart sounds: No murmur heard.   Pulmonary:     Effort: Pulmonary effort is normal. No respiratory distress.     Breath sounds: Normal breath sounds.  Abdominal:     Palpations: Abdomen is soft.     Tenderness: There is no abdominal tenderness.  Musculoskeletal:     Cervical back: Neck supple.  Skin:    General: Skin is warm and  dry.  Neurological:     Mental Status: He is alert.     ED Results / Procedures / Treatments   Labs (all labs ordered are listed, but only abnormal results are displayed) Labs Reviewed  COMPREHENSIVE METABOLIC PANEL - Abnormal; Notable for the following components:      Result Value   Glucose, Bld 124 (*)    AST 12 (*)    All other components within normal limits  CBC - Abnormal; Notable for the following components:   MCHC 36.2 (*)    All other components within normal limits  URINALYSIS, ROUTINE W REFLEX MICROSCOPIC - Abnormal; Notable for the following components:   Color, Urine STRAW (*)    pH 9.0 (*)    All other components within normal limits  RESP PANEL BY RT-PCR  (FLU A&B, COVID) ARPGX2  LIPASE, BLOOD    EKG None  Radiology No results found.  Procedures Procedures   Medications Ordered in ED Medications  ondansetron (ZOFRAN-ODT) disintegrating tablet 4 mg (4 mg Oral Given 12/10/20 1258)    ED Course  I have reviewed the triage vital signs and the nursing notes.  Pertinent labs & imaging results that were available during my care of the patient were reviewed by me and considered in my medical decision making (see chart for details).    MDM Rules/Calculators/A&P                          25 year old male presenting for evaluation of chills, malaise and episode of vomiting prior to arrival.  Abdomen is soft and nontender here in the ED.  He is afebrile and his work-up here is reassuring with no leukocytosis, no anemia, normal electrolytes and kidney function and normal LFTs.  UA negative for UTI.  I suspect viral syndrome.  Covid/flu pending at the time of discharge.  If positive will contact patient with results, if negative patient would not be contacted.  He is advised to follow-up with PCP and return to the ED for new or worsening symptoms.  He voiced understanding the plan and reasons to return.  All questions answered.  Patient stable for discharge.  Final Clinical Impression(s) / ED Diagnoses Final diagnoses:  Non-intractable vomiting, presence of nausea not specified, unspecified vomiting type    Rx / DC Orders ED Discharge Orders         Ordered    ondansetron (ZOFRAN) 4 MG tablet  Every 6 hours        12/10/20 1243    oseltamivir (TAMIFLU) 75 MG capsule  Every 12 hours        12/10/20 1320           Dhara Schepp S, PA-C 12/10/20 1320    Tegeler, Canary Brim, MD 12/10/20 1423

## 2021-03-23 ENCOUNTER — Emergency Department (HOSPITAL_COMMUNITY): Payer: Self-pay

## 2021-03-23 ENCOUNTER — Encounter (HOSPITAL_COMMUNITY): Payer: Self-pay

## 2021-03-23 ENCOUNTER — Emergency Department (HOSPITAL_COMMUNITY)
Admission: EM | Admit: 2021-03-23 | Discharge: 2021-03-23 | Disposition: A | Discharge status: Home / Self Care | Payer: Self-pay | Attending: Emergency Medicine | Admitting: Emergency Medicine

## 2021-03-23 ENCOUNTER — Other Ambulatory Visit: Payer: Self-pay

## 2021-03-23 DIAGNOSIS — R1110 Vomiting, unspecified: Secondary | ICD-10-CM | POA: Insufficient documentation

## 2021-03-23 DIAGNOSIS — R10812 Left upper quadrant abdominal tenderness: Secondary | ICD-10-CM | POA: Insufficient documentation

## 2021-03-23 DIAGNOSIS — J45909 Unspecified asthma, uncomplicated: Secondary | ICD-10-CM | POA: Insufficient documentation

## 2021-03-23 DIAGNOSIS — Z87891 Personal history of nicotine dependence: Secondary | ICD-10-CM | POA: Insufficient documentation

## 2021-03-23 DIAGNOSIS — R112 Nausea with vomiting, unspecified: Secondary | ICD-10-CM

## 2021-03-23 LAB — COMPREHENSIVE METABOLIC PANEL
ALT: 6 U/L (ref 0–44)
AST: 13 U/L — ABNORMAL LOW (ref 15–41)
Albumin: 4.3 g/dL (ref 3.5–5.0)
Alkaline Phosphatase: 49 U/L (ref 38–126)
Anion gap: 9 (ref 5–15)
BUN: 11 mg/dL (ref 6–20)
CO2: 28 mmol/L (ref 22–32)
Calcium: 9.4 mg/dL (ref 8.9–10.3)
Chloride: 103 mmol/L (ref 98–111)
Creatinine, Ser: 0.96 mg/dL (ref 0.61–1.24)
GFR, Estimated: >60 mL/min (ref >60)
Glucose, Bld: 91 mg/dL (ref 70–99)
Potassium: 4 mmol/L (ref 3.5–5.1)
Sodium: 140 mmol/L (ref 135–145)
Total Bilirubin: 0.6 mg/dL (ref 0.3–1.2)
Total Protein: 6.9 g/dL (ref 6.5–8.1)

## 2021-03-23 LAB — LIPASE, BLOOD: Lipase: 41 U/L (ref 11–51)

## 2021-03-23 LAB — URINALYSIS, ROUTINE W REFLEX MICROSCOPIC
Bacteria, UA: NONE SEEN
Bilirubin Urine: NEGATIVE
Glucose, UA: NEGATIVE mg/dL
Hgb urine dipstick: NEGATIVE
Ketones, ur: 5 mg/dL — AB
Nitrite: NEGATIVE
Protein, ur: 30 mg/dL — AB
Specific Gravity, Urine: 1.028 (ref 1.005–1.030)
pH: 5 (ref 5.0–8.0)

## 2021-03-23 LAB — CBC
HCT: 45.7 % (ref 39.0–52.0)
Hemoglobin: 16.6 g/dL (ref 13.0–17.0)
MCH: 30.1 pg (ref 26.0–34.0)
MCHC: 36.3 g/dL — ABNORMAL HIGH (ref 30.0–36.0)
MCV: 82.9 fL (ref 80.0–100.0)
Platelets: 292 10*3/uL (ref 150–400)
RBC: 5.51 MIL/uL (ref 4.22–5.81)
RDW: 14.3 % (ref 11.5–15.5)
WBC: 10.7 10*3/uL — ABNORMAL HIGH (ref 4.0–10.5)
nRBC: 0 % (ref 0.0–0.2)

## 2021-03-23 MED ORDER — ALUM & MAG HYDROXIDE-SIMETH 200-200-20 MG/5ML PO SUSP
30.0000 mL | Freq: Once | ORAL | Status: AC
  Administered 2021-03-23: 30 mL via ORAL
  Filled 2021-03-23: qty 30

## 2021-03-23 MED ORDER — IOHEXOL 350 MG/ML SOLN
85.0000 mL | Freq: Once | INTRAVENOUS | Status: AC | PRN
  Administered 2021-03-23: 85 mL via INTRAVENOUS

## 2021-03-23 MED ORDER — ONDANSETRON HCL 4 MG/2ML IJ SOLN
4.0000 mg | Freq: Once | INTRAMUSCULAR | Status: AC
  Administered 2021-03-23: 4 mg via INTRAVENOUS
  Filled 2021-03-23: qty 2

## 2021-03-23 MED ORDER — IOHEXOL 300 MG/ML  SOLN: 85.0000 mL | Freq: Once | INTRAMUSCULAR | Status: DC | PRN

## 2021-03-23 MED ORDER — SODIUM CHLORIDE 0.9 % IV BOLUS
1000.0000 mL | Freq: Once | INTRAVENOUS | Status: AC
  Administered 2021-03-23: 1000 mL via INTRAVENOUS

## 2021-03-23 MED ORDER — ESOMEPRAZOLE MAGNESIUM 40 MG PO PACK: 20.0000 mg | PACK | Freq: Every day | ORAL | 12 refills | Status: DC

## 2021-03-23 MED ORDER — DICYCLOMINE HCL 10 MG PO CAPS
10.0000 mg | ORAL_CAPSULE | Freq: Once | ORAL | Status: AC
  Administered 2021-03-23: 10 mg via ORAL
  Filled 2021-03-23: qty 1

## 2021-03-23 MED ORDER — ESOMEPRAZOLE MAGNESIUM 20 MG PO PACK: 20.0000 mg | PACK | Freq: Every day | ORAL | 0 refills | Status: DC

## 2021-03-23 MED ORDER — FAMOTIDINE IN NACL 20-0.9 MG/50ML-% IV SOLN
20.0000 mg | Freq: Once | INTRAVENOUS | Status: AC
  Administered 2021-03-23: 20 mg via INTRAVENOUS
  Filled 2021-03-23: qty 50

## 2021-03-23 MED ORDER — ONDANSETRON 4 MG PO TBDP: 4.0000 mg | ORAL_TABLET | Freq: Once | ORAL | Status: DC

## 2021-03-23 MED ORDER — ONDANSETRON 4 MG PO TBDP: 4.0000 mg | ORAL_TABLET | Freq: Three times a day (TID) | ORAL | 0 refills | Status: DC | PRN

## 2021-03-23 NOTE — ED Provider Notes (Signed)
[facility]-EMERGENCY DEPT Provider Note   CSN: [id number] Arrival date & time: 03/23/21  7124     History Chief Complaint  Patient presents with   Abdominal Pain   Emesis    Drew [name] is a 25 y.o. male with past medical history of chronic abdominal upset and vomiting since childhood.  Patient states that he frequently has episodes of vomiting has been evaluated by gastroenterology in the past.  Over the past several days he states that his vomiting has been excessive and he has had episodes of vomiting almost every 30 minutes.  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  He also states that he feels really lightheaded and weak when he stands.  He has had no bowel movement in the past few days but he feel that this is secondary to poor p.o. intake.  He is not taking anything for vomiting and states that he does not take any medications for his abdominal issues on a regular basis.  He denies any diarrhea, constipation, melena, or other stool changes.  He has mild constant nausea which is worsened prior to vomiting.  He complains of left upper quadrant abdominal pain which seems new.  The pain is constant, achy, colicky and does not radiate.  He denies urinary symptoms.   Abdominal Pain Associated symptoms: vomiting   Emesis Associated symptoms: abdominal pain       Past Medical History:  Diagnosis Date   Abscess    Asthma     There are no problems to display for this patient.   Past Surgical History:  Procedure Laterality Date   FRACTURE SURGERY         Family History  Adopted: Yes    Social History   Tobacco Use   Smoking status: Former    Types: Cigarettes   Smokeless tobacco: Never  Vaping Use   Vaping Use: Some days   Substances: Flavoring  Substance Use Topics   Alcohol use: Not Currently   Drug use: Yes    Types: Marijuana    Home  Medications Prior to Admission medications   Medication Sig Start Date End Date Taking? Authorizing Provider  ondansetron (ZOFRAN) 4 MG tablet Take 1 tablet (4 mg total) by mouth every 6 (six) hours. 12/10/20   Couture, Cortni S, PA-C  oseltamivir (TAMIFLU) 75 MG capsule Take 1 capsule (75 mg total) by mouth every 12 (twelve) hours. 12/10/20   Couture, Cortni S, PA-C    Allergies    Dairy aid [lactase], Silver, Chocolate, and Shellfish allergy  Review of Systems   Review of Systems  Gastrointestinal:  Positive for abdominal pain and vomiting.  Ten systems reviewed and are negative for acute change, except as noted in the HPI.   Physical Exam Updated Vital Signs BP 129/90 (BP Location: Left Arm)   Pulse 73   Temp 98.4 F (36.9 C) (Oral)   Resp 16   Ht 5' 7.5" (1.715 m)   Wt 60.6 kg   SpO2 100%   BMI 20.62 kg/m   Physical Exam Vitals and nursing note reviewed.  Constitutional:      General: He is not in acute distress.    Appearance: He is well-developed. He is not diaphoretic.  HENT:     Head: Normocephalic and atraumatic.  Eyes:     General: No scleral icterus.    Conjunctiva/sclera: Conjunctivae normal.  Cardiovascular:  Rate and Rhythm: Normal rate and regular rhythm.     Heart sounds: Normal heart sounds.  Pulmonary:     Effort: Pulmonary effort is normal. No respiratory distress.     Breath sounds: Normal breath sounds.  Abdominal:     General: Abdomen is flat.     Palpations: Abdomen is soft.     Tenderness: There is abdominal tenderness in the left upper quadrant. There is no guarding or rebound.     Hernia: No hernia is present.  Musculoskeletal:     Cervical back: Normal range of motion and neck supple.  Skin:    General: Skin is warm and dry.  Neurological:     Mental Status: He is alert.  Psychiatric:        Behavior: Behavior normal.    ED Results / Procedures / Treatments   Labs (all labs ordered are listed, but only abnormal results are  displayed) Labs Reviewed  LIPASE, BLOOD  COMPREHENSIVE METABOLIC PANEL  CBC  URINALYSIS, ROUTINE W REFLEX MICROSCOPIC    EKG None  Radiology No results found.  Procedures Procedures   Medications Ordered in ED Medications - No data to display  ED Course  I have reviewed the triage vital signs and the nursing notes.  Pertinent labs & imaging results that were available during my care of the patient were reviewed by me and considered in my medical decision making (see chart for details).    MDM Rules/Calculators/A&P                           CC: Vomiting  VS:  Vitals:   03/23/21 0739 03/23/21 0915 03/23/21 1000 03/23/21 1030  BP:  126/83 117/72 92/64  Pulse:  65 69 69  Resp:  16 18   Temp:      TempSrc:      SpO2:  97% 98% 96%  Weight: 60.6 kg     Height: 5' 7.5" (1.715 m)       ZS:WFUXNAT is gathered by patient and EMR. Previous records obtained and reviewed. DDX:The patient's complaint of vomiting involves an extensive number of diagnostic and treatment options, and is a complaint that carries with it a high risk of complications, morbidity, and potential mortality. Given the large differential diagnosis, medical decision making is of high complexity. The emergent differential diagnosis for vomiting includes, but is not limited to ACS/MI, Boerhaave's, DKA, Intracranial Hemorrhage, Ischemic bowel, Meningitis, Sepsis, Acute gastric dilation, Acetaminophen toxicity, Adrenal insufficiency, Appendicitis, Aspirin toxicity, Bowel obstruction/ileus, Carbon monoxide poisoning, Cholecystitis, CNS tumor. Digoxin toxicity, Electrolyte abnormalities, Elevated ICP, Gastric outlet obstruction, Hyperemesis gravidarum, Pancreatitis, Peritonitis, Ruptured viscus, Testicular torsion/ovarian torsion, Theophyline toxicity, Biliary colic, Cannabinoid hyperemesis syndrome, Chemotherapy, Disulfiram effect, Erythromycin, ETOH, Gastritis, Gastroenteritis, Gastroparesis, Hepatitis, Ibuprofen,  Ipecac toxicity, Labyrinthitis, Migraine, Motion sickness, Narcotic withdrawal, Thyroid, Pregnancy, Peptic ulcer disease, Renal colic, and UTI  Labs: I ordered reviewed and interpreted labs which include CBC which shows mildly elevated white blood cell count of 10.7, CMP without significant abnormality, urine shows no evidence of infection Imaging: I ordered and reviewed images which included CT abdomen pelvis with contrast. I independently visualized and interpreted all imaging.There are no acute, significant findings on today's images. EKG: Consults:  MDM: Patient here with abdominal pain and vomiting.  He was given IV fluids and Zofran with complete resolution of his symptoms feeling markedly improved.  He has had no active vomiting here in the emergency department.  He still feels some  reflux symptoms and waterbrash so I suspect he has a component of gastroesophageal reflux disorder.  I have ordered Nexium, Zofran. Patient disposition:The patient appears reasonably screened and/or stabilized for discharge and I doubt any other medical condition or other [facility] requiring further screening, evaluation, or treatment in the ED at this time prior to discharge. I have discussed lab and/or imaging findings with the patient and answered all questions/concerns to the best of my ability.I have discussed return precautions and OP follow up.   Final Clinical Impression(s) / ED Diagnoses Final diagnoses:  None    Rx / DC Orders ED Discharge Orders     None        Arthor Captain, PA-C 03/23/21 1545    Terald Sleeper, MD 03/23/21 1740

## 2021-03-23 NOTE — Discharge Instructions (Addendum)
Get help right away if: You have pain in your chest, neck, arm, or jaw. You feel extremely weak or you faint. You have persistent vomiting. You have vomit that is bright red or looks like black coffee grounds. You have bloody or black stools or stools that look like tar. You have a severe headache, a stiff neck, or both. You have severe pain, cramping, or bloating in your abdomen. You have difficulty breathing, or you are breathing very quickly. Your heart is beating very quickly. Your skin feels cold and clammy. You feel confused. You have signs of dehydration, such as: Dark urine, very little urine, or no urine. Cracked lips. Dry mouth. Sunken eyes. Sleepiness. Weakness. 

## 2021-03-23 NOTE — ED Triage Notes (Signed)
Patient c/o intermittent LUQ abdominal pain and vomiting  x 4 days. Patient denies any diarrhea

## 2021-04-22 ENCOUNTER — Emergency Department (HOSPITAL_COMMUNITY)
Admission: EM | Admit: 2021-04-22 | Discharge: 2021-04-23 | Disposition: A | Discharge status: Home / Self Care | Payer: Self-pay | Attending: Emergency Medicine | Admitting: Emergency Medicine

## 2021-04-22 ENCOUNTER — Encounter (HOSPITAL_COMMUNITY): Payer: Self-pay

## 2021-04-22 ENCOUNTER — Emergency Department (HOSPITAL_COMMUNITY): Payer: Self-pay

## 2021-04-22 ENCOUNTER — Other Ambulatory Visit: Payer: Self-pay

## 2021-04-22 DIAGNOSIS — S0181XA Laceration without foreign body of other part of head, initial encounter: Secondary | ICD-10-CM | POA: Insufficient documentation

## 2021-04-22 DIAGNOSIS — Y92410 Unspecified street and highway as the place of occurrence of the external cause: Secondary | ICD-10-CM | POA: Insufficient documentation

## 2021-04-22 DIAGNOSIS — Z5321 Procedure and treatment not carried out due to patient leaving prior to being seen by health care provider: Secondary | ICD-10-CM | POA: Insufficient documentation
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
## 2021-04-23 NOTE — ED Notes (Signed)
Pt reports that he is leaving so that he can get to work in the AM.

## 2021-05-20 ENCOUNTER — Other Ambulatory Visit: Payer: Self-pay

## 2021-05-20 ENCOUNTER — Encounter (HOSPITAL_COMMUNITY): Payer: Self-pay

## 2021-05-20 ENCOUNTER — Emergency Department (HOSPITAL_COMMUNITY)
Admission: EM | Admit: 2021-05-20 | Discharge: 2021-05-20 | Disposition: A | Discharge status: Home / Self Care | Payer: Self-pay | Attending: Emergency Medicine | Admitting: Emergency Medicine

## 2021-05-20 DIAGNOSIS — K219 Gastro-esophageal reflux disease without esophagitis: Secondary | ICD-10-CM | POA: Insufficient documentation

## 2021-05-20 DIAGNOSIS — Z87891 Personal history of nicotine dependence: Secondary | ICD-10-CM | POA: Insufficient documentation

## 2021-05-20 DIAGNOSIS — J45909 Unspecified asthma, uncomplicated: Secondary | ICD-10-CM | POA: Insufficient documentation

## 2021-05-20 MED ORDER — SUCRALFATE 1 G PO TABS: 1.0000 g | ORAL_TABLET | Freq: Three times a day (TID) | ORAL | 1 refills | Status: AC

## 2021-05-20 MED ORDER — ESOMEPRAZOLE MAGNESIUM 20 MG PO PACK: 20.0000 mg | PACK | Freq: Every day | ORAL | 0 refills | Status: AC

## 2021-05-20 NOTE — ED Provider Notes (Signed)
[facility]-EMERGENCY DEPT Provider Note   CSN: [id number] Arrival date & time: 05/20/21  2056     History Chief Complaint  Patient presents with   Gastroesophageal Reflux   Emesis    Drew [name] is a 25 y.o. male.  He is here with a complaint of frequent vomiting and some burning epigastric pain.  Radiates into chest.  This is been a longstanding problem for him.  He is currently taking Nexium and Zofran.  He is cut back on alcohol and marijuana.  Does not use NSAIDs.  He said he vomited a lot at work and so his boss wanted him to get checked out before he came back.  He denies any fevers or chills.  No pain currently.  Tolerating p.o. without any difficulty.  No blood in the vomitus.  No dizziness fevers chills.  States the burning pain sometimes radiate into his chest but he is not having any of that now.  The history is provided by the patient.  Gastroesophageal Reflux This is a chronic problem. The current episode started more than 1 week ago. The problem occurs daily. The problem has not changed since onset.Associated symptoms include chest pain and abdominal pain. Pertinent negatives include no headaches and no shortness of breath. Nothing aggravates the symptoms. Relieved by: vomiting. He has tried nothing for the symptoms. The treatment provided no relief.      Past Medical History:  Diagnosis Date   Abscess    Asthma     There are no problems to display for this patient.   Past Surgical History:  Procedure Laterality Date   FRACTURE SURGERY         Family History  Adopted: Yes    Social History   Tobacco Use   Smoking status: Former    Types: Cigarettes   Smokeless tobacco: Never  Vaping Use   Vaping Use: Some days   Substances: Flavoring  Substance Use Topics   Alcohol use: Not Currently   Drug use: Yes    Types: Marijuana    Home Medications Prior to Admission medications   Medication Sig Start Date End Date Taking?  Authorizing Provider  esomeprazole (NEXIUM) 20 MG packet Take 20 mg by mouth daily before breakfast. 03/23/21   Arthor Captain, PA-C  ondansetron (ZOFRAN ODT) 4 MG disintegrating tablet Take 1 tablet (4 mg total) by mouth every 8 (eight) hours as needed for nausea or vomiting. 03/23/21   Arthor Captain, PA-C  ondansetron (ZOFRAN) 4 MG tablet Take 1 tablet (4 mg total) by mouth every 6 (six) hours. 12/10/20   Couture, Cortni S, PA-C  oseltamivir (TAMIFLU) 75 MG capsule Take 1 capsule (75 mg total) by mouth every 12 (twelve) hours. 12/10/20   Couture, Cortni S, PA-C    Allergies    Dairy aid [lactase], Silver, Chocolate, and Shellfish allergy  Review of Systems   Review of Systems  Constitutional:  Negative for fever.  HENT:  Negative for sore throat.   Eyes:  Negative for visual disturbance.  Respiratory:  Negative for shortness of breath.   Cardiovascular:  Positive for chest pain.  Gastrointestinal:  Positive for abdominal pain and vomiting.  Genitourinary:  Negative for dysuria.  Musculoskeletal:  Negative for neck pain.  Skin:  Negative for rash.  Neurological:  Negative for headaches.   Physical Exam Updated Vital Signs BP (!) 130/102 (BP Location: Right Arm)   Pulse 90   Temp 98.4 F (36.9 C) (Oral)   Resp 18  Ht 5\' 7"  (1.702 m)   Wt 61.2 kg   SpO2 96%   BMI 21.14 kg/m   Physical Exam Vitals and nursing note reviewed.  Constitutional:      Appearance: Normal appearance. He is well-developed.  HENT:     Head: Normocephalic and atraumatic.  Eyes:     Conjunctiva/sclera: Conjunctivae normal.  Cardiovascular:     Rate and Rhythm: Normal rate and regular rhythm.     Heart sounds: No murmur heard. Pulmonary:     Effort: Pulmonary effort is normal. No respiratory distress.     Breath sounds: Normal breath sounds.  Abdominal:     Palpations: Abdomen is soft.     Tenderness: There is no abdominal tenderness. There is no guarding or rebound.  Musculoskeletal:      Cervical back: Neck supple.  Skin:    General: Skin is warm and dry.  Neurological:     General: No focal deficit present.     Mental Status: He is alert.    ED Results / Procedures / Treatments   Labs (all labs ordered are listed, but only abnormal results are displayed) Labs Reviewed - No data to display  EKG None  Radiology No results found.  Procedures Procedures   Medications Ordered in ED Medications - No data to display  ED Course  I have reviewed the triage vital signs and the nursing notes.  Pertinent labs & imaging results that were available during my care of the patient were reviewed by me and considered in my medical decision making (see chart for details).    MDM Rules/Calculators/A&P                          Patient here for likely reflux symptoms, burning epigastric pain into his chest associated with vomiting.  He has a completely benign exam.  He has been seen here before for this.  He is taking a PPI and using Zofran.  Does not have insurance so he is unable to follow-up with GI.  Will give prescription for Carafate.  He was offered labs but he declines them at this time.  Final Clinical Impression(s) / ED Diagnoses Final diagnoses:  Gastroesophageal reflux disease, unspecified whether esophagitis present    Rx / DC Orders ED Discharge Orders          Ordered    sucralfate (CARAFATE) 1 g tablet  3 times daily with meals & bedtime        05/20/21 2140    esomeprazole (NEXIUM) 20 MG packet  Daily before breakfast        05/20/21 2140             2141, MD 05/21/21 1052

## 2021-05-20 NOTE — ED Triage Notes (Signed)
Pt complains of vomiting and states that he has acid reflux but does not take medication. Pt states that it feels better after he throws up.

## 2021-10-26 ENCOUNTER — Emergency Department (HOSPITAL_COMMUNITY): Payer: Self-pay

## 2021-10-26 ENCOUNTER — Other Ambulatory Visit: Payer: Self-pay

## 2021-10-26 ENCOUNTER — Encounter (HOSPITAL_COMMUNITY): Payer: Self-pay

## 2021-10-26 ENCOUNTER — Emergency Department (HOSPITAL_COMMUNITY)
Admission: EM | Admit: 2021-10-26 | Discharge: 2021-10-26 | Disposition: A | Discharge status: Home / Self Care | Payer: Self-pay | Attending: Emergency Medicine | Admitting: Emergency Medicine

## 2021-10-26 DIAGNOSIS — E119 Type 2 diabetes mellitus without complications: Secondary | ICD-10-CM | POA: Insufficient documentation

## 2021-10-26 DIAGNOSIS — Z20822 Contact with and (suspected) exposure to covid-19: Secondary | ICD-10-CM | POA: Insufficient documentation

## 2021-10-26 DIAGNOSIS — M7989 Other specified soft tissue disorders: Secondary | ICD-10-CM | POA: Insufficient documentation

## 2021-10-26 DIAGNOSIS — R519 Headache, unspecified: Secondary | ICD-10-CM | POA: Insufficient documentation

## 2021-10-26 LAB — I-STAT CHEM 8, ED
BUN: 5 mg/dL — ABNORMAL LOW (ref 6–20)
Calcium, Ion: 1.26 mmol/L (ref 1.15–1.40)
Chloride: 102 mmol/L (ref 98–111)
Creatinine, Ser: 0.9 mg/dL (ref 0.61–1.24)
Glucose, Bld: 98 mg/dL (ref 70–99)
HCT: 45 % (ref 39.0–52.0)
Hemoglobin: 15.3 g/dL (ref 13.0–17.0)
Potassium: 4.9 mmol/L (ref 3.5–5.1)
Sodium: 142 mmol/L (ref 135–145)
TCO2: 32 mmol/L (ref 22–32)

## 2021-10-26 LAB — RESP PANEL BY RT-PCR (FLU A&B, COVID) ARPGX2
Influenza A by PCR: NEGATIVE
Influenza B by PCR: NEGATIVE
SARS Coronavirus 2 by RT PCR: NEGATIVE

## 2021-10-26 LAB — CBG MONITORING, ED: Glucose-Capillary: 99 mg/dL (ref 70–99)

## 2021-10-26 MED ORDER — DOXYCYCLINE HYCLATE 100 MG PO TABS
100.0000 mg | ORAL_TABLET | Freq: Once | ORAL | Status: AC
  Administered 2021-10-26: 100 mg via ORAL
  Filled 2021-10-26: qty 1

## 2021-10-26 MED ORDER — KETOROLAC TROMETHAMINE 30 MG/ML IJ SOLN
30.0000 mg | Freq: Once | INTRAMUSCULAR | Status: AC
  Administered 2021-10-26: 30 mg via INTRAVENOUS
  Filled 2021-10-26: qty 1

## 2021-10-26 MED ORDER — IBUPROFEN 800 MG PO TABS: 800.0000 mg | ORAL_TABLET | Freq: Three times a day (TID) | ORAL | 0 refills | Status: AC | PRN

## 2021-10-26 MED ORDER — DOXYCYCLINE HYCLATE 100 MG PO CAPS: 100.0000 mg | ORAL_CAPSULE | Freq: Two times a day (BID) | ORAL | 0 refills | Status: AC

## 2021-10-26 NOTE — ED Provider Triage Note (Signed)
Emergency Medicine Provider Triage Evaluation Note ? ?Drew [name] , a 26 y.o. male  was evaluated in triage.  Pt complains of left sided ha. Began 2 days ago. No vision changes, neck pain, fever, emesis. Feels like he cannot take a deep breath however denies over CP, SOB. No cough, emesis. No LE swelling, hx of PE, DVT ? ?Review of Systems  ?Positive: Ha, difficulty taking a deep breath ?Negative: Fever, neck pain, vision changes ? ?Physical Exam  ?BP 137/85 (BP Location: Left Arm)   Pulse 66   Temp 98.5 ?F (36.9 ?C) (Oral)   Resp 18   Ht 5\' 7"  (1.702 m)   Wt 68 kg   SpO2 97%   BMI 23.49 kg/m?  ?Gen:   Awake, no distress   ?Resp:  Normal effort  ?MSK:   Moves extremities without difficulty  ?Neuro:  Cn 2-12 grossly intact, ambulatory ?Other:   ? ?Medical Decision Making  ?Medically screening exam initiated at 7:54 PM.  Appropriate orders placed.  Drew [name] was informed that the remainder of the evaluation will be completed by another provider, this initial triage assessment does not replace that evaluation, and the importance of remaining in the ED until their evaluation is complete. ? ?Ha, difficulty taking a deep breath ?  ?Aaryanna Hyden A, PA-C ?10/26/21 1956 ? ?

## 2021-10-26 NOTE — ED Provider Notes (Addendum)
?[facility]-EMERGENCY DEPT ?Provider Note ? ? ?CSN: [id number] ?Arrival date & time: 10/26/21  1916 ? ?  ? ?History ? ?Chief Complaint  ?Patient presents with  ? Headache  ? ? ?Drew [name] is a 26 y.o. male. ? ?Patient complains of a headache.  Patient has a history of headaches. ? ?The history is provided by the patient and medical records. No language interpreter was used.  ?Headache ?Pain location:  Generalized ?Quality:  Dull ?Radiates to:  Does not radiate ?Severity currently:  3/10 ?Severity at highest:  6/10 ?Onset quality:  Sudden ?Timing:  Constant ?Progression:  Worsening ?Chronicity:  New ?Similar to prior headaches: no   ?Associated symptoms: no abdominal pain, no back pain, no congestion, no cough, no diarrhea, no fatigue, no seizures and no sinus pressure   ? ?  ? ?Home Medications ?Prior to Admission medications   ?Medication Sig Start Date End Date Taking? Authorizing Provider  ?doxycycline (VIBRAMYCIN) 100 MG capsule Take 1 capsule (100 mg total) by mouth 2 (two) times daily. 10/26/21  Yes Bethann Berkshire, MD  ?ibuprofen (ADVIL) 800 MG tablet Take 1 tablet (800 mg total) by mouth every 8 (eight) hours as needed for moderate pain. 10/26/21  Yes Bethann Berkshire, MD  ?esomeprazole (NEXIUM) 20 MG packet Take 20 mg by mouth daily before breakfast. 05/20/21   Terrilee Files, MD  ?ondansetron (ZOFRAN ODT) 4 MG disintegrating tablet Take 1 tablet (4 mg total) by mouth every 8 (eight) hours as needed for nausea or vomiting. 03/23/21   Arthor Captain, PA-C  ?ondansetron (ZOFRAN) 4 MG tablet Take 1 tablet (4 mg total) by mouth every 6 (six) hours. 12/10/20   Couture, Cortni S, PA-C  ?oseltamivir (TAMIFLU) 75 MG capsule Take 1 capsule (75 mg total) by mouth every 12 (twelve) hours. 12/10/20   Couture, Cortni S, PA-C  ?sucralfate (CARAFATE) 1 g tablet Take 1 tablet (1 g total) by mouth 4 (four) times daily -  with meals and at bedtime. 05/20/21   Terrilee Files, MD  ?   ? ?Allergies     ?Dairy aid [tilactase], Silver, Chocolate, and Shellfish allergy   ? ?Review of Systems   ?Review of Systems  ?Constitutional:  Negative for appetite change and fatigue.  ?HENT:  Negative for congestion, ear discharge and sinus pressure.   ?Eyes:  Negative for discharge.  ?Respiratory:  Negative for cough.   ?Cardiovascular:  Negative for chest pain.  ?Gastrointestinal:  Negative for abdominal pain and diarrhea.  ?Genitourinary:  Negative for frequency and hematuria.  ?Musculoskeletal:  Negative for back pain.  ?Skin:  Negative for rash.  ?Neurological:  Positive for headaches. Negative for seizures.  ?Psychiatric/Behavioral:  Negative for hallucinations.   ? ?Physical Exam ?Updated Vital Signs ?BP 111/72   Pulse 61   Temp 98.5 ?F (36.9 ?C) (Oral)   Resp 18   Ht 5\' 7"  (1.702 m)   Wt 68 kg   SpO2 95%   BMI 23.49 kg/m?  ?Physical Exam ?Vitals and nursing note reviewed.  ?Constitutional:   ?   Appearance: He is well-developed.  ?HENT:  ?   Head: Normocephalic.  ?   Comments: Minimal swelling tenderness above his left eye ?   Nose: Nose normal.  ?Eyes:  ?   General: No scleral icterus. ?   Conjunctiva/sclera: Conjunctivae normal.  ?Neck:  ?   Thyroid: No thyromegaly.  ?Cardiovascular:  ?   Rate and Rhythm: Normal rate and regular rhythm.  ?   Heart  sounds: No murmur heard. ?  No friction rub. No gallop.  ?Pulmonary:  ?   Breath sounds: No stridor. No wheezing or rales.  ?Chest:  ?   Chest wall: No tenderness.  ?Abdominal:  ?   General: There is no distension.  ?   Tenderness: There is no abdominal tenderness. There is no rebound.  ?Musculoskeletal:     ?   General: Normal range of motion.  ?   Cervical back: Neck supple.  ?Lymphadenopathy:  ?   Cervical: No cervical adenopathy.  ?Skin: ?   Findings: No erythema or rash.  ?Neurological:  ?   Mental Status: He is alert and oriented to person, place, and time.  ?   Motor: No abnormal muscle tone.  ?   Coordination: Coordination normal.  ?Psychiatric:     ?    Behavior: Behavior normal.  ? ? ?ED Results / Procedures / Treatments   ?Labs ?(all labs ordered are listed, but only abnormal results are displayed) ?Labs Reviewed  ?I-STAT CHEM 8, ED - Abnormal; Notable for the following components:  ?    Result Value  ? BUN 5 (*)   ? All other components within normal limits  ?RESP PANEL BY RT-PCR (FLU A&B, COVID) ARPGX2  ?CBG MONITORING, ED  ? ? ?EKG ?EKG Interpretation ? ?Date/Time:  Friday October 26 2021 19:37:15 EST ?Ventricular Rate:  82 ?PR Interval:  173 ?QRS Duration: 77 ?QT Interval:  344 ?QTC Calculation: 402 ?R Axis:   101 ?Text Interpretation: Sinus rhythm Borderline right axis deviation Confirmed by Bethann Berkshire [phone]) on 10/26/2021 10:21:32 PM ? ?Radiology ?DG Chest 2 View ? ?Result Date: 10/26/2021 ?CLINICAL DATA:  Chest pain. EXAM: CHEST - 2 VIEW COMPARISON:  11/25/2020 FINDINGS: Cardiac silhouette and mediastinal contours are within normal limits. The lungs are clear. No pleural effusion or pneumothorax. No acute skeletal abnormality. IMPRESSION: No active cardiopulmonary disease. Electronically Signed   By: Neita Garnet M.D.   On: 10/26/2021 20:21   ? ?Procedures ?Procedures  ? ? ?Medications Ordered in ED ?Medications  ?doxycycline (VIBRA-TABS) tablet 100 mg (has no administration in time range)  ?ketorolac (TORADOL) 30 MG/ML injection 30 mg (30 mg Intravenous Given 10/26/21 2218)  ? ? ?ED Course/ Medical Decision Making/ A&P ?  ?                        ?Medical Decision Making ?Risk ?Prescription drug management. ? ?This patient presents to the ED for concern of headache, this involves an extensive number of treatment options, and is a complaint that carries with it a high risk of complications and morbidity.  The differential diagnosis includes tension headache, diabetes ? ? ?Co morbidities that complicate the patient evaluation ? ?None ? ? ?Additional history obtained: ? ?Additional history obtained from patient ?External records from outside source obtained  and reviewed including hospital record ? ? ?Lab Tests: ? ?I Ordered, and personally interpreted labs.  The pertinent results include: CBC and chemistries that were unremarkable along with a negative COVID test ? ? ?Imaging Studies ordered: ? ?I ordered imaging studies including chest x-ray ?I independently visualized and interpreted imaging which showed no acute disease ?I agree with the radiologist interpretation ? ? ?Cardiac Monitoring: ? ?The patient was maintained on a cardiac monitor.  I personally viewed and interpreted the cardiac monitored which showed an underlying rhythm of: Normal sinus rhythm ? ? ?Medicines ordered and prescription drug management: ? ?I ordered medication including Toradol  for headache ?Reevaluation of the patient after these medicines showed that the patient improved ?I have reviewed the patients home medicines and have made adjustments as needed ? ? ?Test Considered: ? ?None ? ? ?Critical Interventions: ? ?None ? ? ?Consultations Obtained: ? ?No consult ? ?Problem List / ED Course: ? ?Headache with possible skin infection ? ? ? ?Social Determinants of Health: ? ?None ? ? ? ? ? ?Patient was given Toradol which has helped his headache.  He does have minimal swelling over his left eye.  He will be placed on doxycycline for possible skin infection and follow-up next week for recheck ? ? ? ? ? ? ? ?Final Clinical Impression(s) / ED Diagnoses ?Final diagnoses:  ?Bad headache  ? ? ?Rx / DC Orders ?ED Discharge Orders   ? ?      Ordered  ?  doxycycline (VIBRAMYCIN) 100 MG capsule  2 times daily       ? 10/26/21 2238  ?  ibuprofen (ADVIL) 800 MG tablet  Every 8 hours PRN       ? 10/26/21 2238  ? ?  ?  ? ?  ? ? ?  ?Bethann Berkshire, MD ?10/27/21 [phone] ? ?  ?Bethann Berkshire, MD ?10/27/21 [phone] ? ?

## 2021-10-26 NOTE — ED Triage Notes (Signed)
Patient having a bad headache and feels light headed when he takes a deep breath. This began last night at work. Did not take any medications because he said he just lets his body do the work.  ?

## 2021-10-26 NOTE — Discharge Instructions (Signed)
Follow-up next week for recheck.  Return sooner if problems ?

## 2021-11-05 ENCOUNTER — Emergency Department (HOSPITAL_COMMUNITY): Payer: Self-pay

## 2021-11-05 ENCOUNTER — Emergency Department (HOSPITAL_COMMUNITY)
Admission: EM | Admit: 2021-11-05 | Discharge: 2021-11-05 | Disposition: A | Discharge status: Home / Self Care | Payer: Self-pay | Attending: Emergency Medicine | Admitting: Emergency Medicine

## 2021-11-05 ENCOUNTER — Encounter (HOSPITAL_COMMUNITY): Payer: Self-pay

## 2021-11-05 DIAGNOSIS — R519 Headache, unspecified: Secondary | ICD-10-CM | POA: Insufficient documentation

## 2021-11-05 DIAGNOSIS — R072 Precordial pain: Secondary | ICD-10-CM | POA: Insufficient documentation

## 2021-11-05 DIAGNOSIS — L723 Sebaceous cyst: Secondary | ICD-10-CM | POA: Insufficient documentation

## 2021-11-05 DIAGNOSIS — Z87891 Personal history of nicotine dependence: Secondary | ICD-10-CM | POA: Insufficient documentation

## 2021-11-05 DIAGNOSIS — F1210 Cannabis abuse, uncomplicated: Secondary | ICD-10-CM | POA: Insufficient documentation

## 2021-11-05 LAB — CBC
HCT: 48 % (ref 39.0–52.0)
Hemoglobin: 17.6 g/dL — ABNORMAL HIGH (ref 13.0–17.0)
MCH: 30 pg (ref 26.0–34.0)
MCHC: 36.7 g/dL — ABNORMAL HIGH (ref 30.0–36.0)
MCV: 81.8 fL (ref 80.0–100.0)
Platelets: 250 10*3/uL (ref 150–400)
RBC: 5.87 MIL/uL — ABNORMAL HIGH (ref 4.22–5.81)
RDW: 14.7 % (ref 11.5–15.5)
WBC: 9.9 10*3/uL (ref 4.0–10.5)
nRBC: 0 % (ref 0.0–0.2)

## 2021-11-05 LAB — BASIC METABOLIC PANEL
Anion gap: 7 (ref 5–15)
BUN: 13 mg/dL (ref 6–20)
CO2: 32 mmol/L (ref 22–32)
Calcium: 9.6 mg/dL (ref 8.9–10.3)
Chloride: 99 mmol/L (ref 98–111)
Creatinine, Ser: 1.02 mg/dL (ref 0.61–1.24)
GFR, Estimated: >60 mL/min (ref >60)
Glucose, Bld: 96 mg/dL (ref 70–99)
Potassium: 4.6 mmol/L (ref 3.5–5.1)
Sodium: 138 mmol/L (ref 135–145)

## 2021-11-05 LAB — TROPONIN I (HIGH SENSITIVITY): Troponin I (High Sensitivity): 3 ng/L (ref <18)

## 2021-11-05 MED ORDER — MELOXICAM 7.5 MG PO TABS: 7.5000 mg | ORAL_TABLET | Freq: Every day | ORAL | 0 refills | Status: AC

## 2021-11-05 MED ORDER — LIDOCAINE VISCOUS HCL 2 % MT SOLN
15.0000 mL | Freq: Once | OROMUCOSAL | Status: AC
  Administered 2021-11-05: 15 mL via ORAL
  Filled 2021-11-05: qty 15

## 2021-11-05 MED ORDER — MUPIROCIN CALCIUM 2 % EX CREA: 1.0000 "application " | TOPICAL_CREAM | Freq: Two times a day (BID) | CUTANEOUS | 0 refills | Status: AC

## 2021-11-05 MED ORDER — ALUM & MAG HYDROXIDE-SIMETH 200-200-20 MG/5ML PO SUSP
30.0000 mL | Freq: Once | ORAL | Status: AC
Start: 2021-11-05 — End: 2021-11-05
  Administered 2021-11-05: 30 mL via ORAL
  Filled 2021-11-05: qty 30

## 2021-11-05 NOTE — Discharge Instructions (Signed)
Please read and follow all provided instructions. ? ?Your diagnoses today include:  ?1. Precordial pain   ?2. Sebaceous cyst   ?3. Acute nonintractable headache, unspecified headache type   ? ? ?Tests performed today include: ?An EKG of your heart ?A chest x-ray ?Cardiac enzymes - a blood test for heart muscle damage ?Blood counts and electrolytes ?Vital signs. See below for your results today.  ? ?Medications prescribed:  ?Meloxicam - anti-inflammatory pain medication ? ?You have been prescribed an anti-inflammatory medication or NSAID. Take with food. Do not take aspirin, ibuprofen, or naproxen if taking this medication. Take smallest effective dose for the shortest duration needed for your pain. Stop taking if you experience stomach pain or vomiting.  ? ?Take any prescribed medications only as directed. ? ?Follow-up instructions: ?Please follow-up with your primary care provider in 1 week for further evaluation of your symptoms.  ? ?Return instructions:  ?SEEK IMMEDIATE MEDICAL ATTENTION IF: ?You have severe chest pain, especially if the pain is crushing or pressure-like and spreads to the arms, back, neck, or jaw, or if you have sweating, nausea or vomiting, or trouble with breathing. THIS IS AN EMERGENCY. Do not wait to see if the pain will go away. Get medical help at once. Call 911. DO NOT drive yourself to the hospital.  ?Your chest pain gets worse and does not go away after a few minutes of rest.  ?You have an attack of chest pain lasting longer than what you usually experience.  ?You have significant dizziness, if you pass out, or have trouble walking.  ?You have chest pain not typical of your usual pain for which you originally saw your caregiver.  ?The eyebrow swelling becomes worse or you have redness that moves away from the area ?You have any other emergent concerns regarding your health. ? ?Additional Information: ?Chest pain comes from many different causes. Your caregiver has diagnosed you as  having chest pain that is not specific for one problem, but does not require admission.  You are at low risk for an acute heart condition or other serious illness.  ? ?Your vital signs today were: ?BP 125/83 (BP Location: Left Arm)   Pulse 75   Temp 97.7 ?F (36.5 ?C) (Oral)   Resp 16   SpO2 96%  ?If your blood pressure (BP) was elevated above 135/85 this visit, please have this repeated by your doctor within one month. ?-------------- ? ? ?

## 2021-11-05 NOTE — ED Provider Notes (Signed)
[facility]-EMERGENCY DEPT Provider Note   CSN: [id number] Arrival date & time: 11/05/21  0022     History  Chief Complaint  Patient presents with   Chest Pain    Drew [name] is a 26 y.o. male.  Patient with no significant past medical history presents to the emergency department for evaluation of chest pain.  Patient states that it started a couple days ago.  RN triage note stated the patient said it began yesterday at work.  Pain is described as the mid chest to left chest.  It does not radiate.  No associated nausea or vomiting.  It is not changed with eating or drinking.  He does feel short of breath at times.  No wheezing reported.  No fevers or cough.  Patient denies history of hypertension, high cholesterol, diabetes.  He quit smoking a couple of years ago.  Does smoke marijuana occasionally.  Does not know family history because he is adopted.  No treatments prior to arrival. Patient denies risk factors for pulmonary embolism including: unilateral leg swelling, history of DVT/PE/other blood clots, use of exogenous hormones, recent immobilizations, recent surgery, recent travel (>4hr segment), malignancy, hemoptysis.   In addition, patient reports left-sided frontal headache.  He has a small cyst in his eyebrow.  When he was seen in the emergency department on 3/3 he was given doxycycline.  He states that this has not really helped.  States that this area is causing him to have headaches.      Home Medications Prior to Admission medications   Medication Sig Start Date End Date Taking? Authorizing Provider  doxycycline (VIBRAMYCIN) 100 MG capsule Take 1 capsule (100 mg total) by mouth 2 (two) times daily. 10/26/21   Bethann BerkshireZammit, Joseph, MD  esomeprazole (NEXIUM) 20 MG packet Take 20 mg by mouth daily before breakfast. 05/20/21   Terrilee FilesButler, Michael C, MD  ibuprofen (ADVIL) 800 MG tablet Take 1 tablet (800 mg total) by mouth every 8 (eight) hours as needed for  moderate pain. 10/26/21   Bethann BerkshireZammit, Joseph, MD  ondansetron (ZOFRAN ODT) 4 MG disintegrating tablet Take 1 tablet (4 mg total) by mouth every 8 (eight) hours as needed for nausea or vomiting. 03/23/21   Arthor CaptainHarris, Abigail, PA-C  ondansetron (ZOFRAN) 4 MG tablet Take 1 tablet (4 mg total) by mouth every 6 (six) hours. 12/10/20   Couture, Cortni S, PA-C  oseltamivir (TAMIFLU) 75 MG capsule Take 1 capsule (75 mg total) by mouth every 12 (twelve) hours. 12/10/20   Couture, Cortni S, PA-C  sucralfate (CARAFATE) 1 g tablet Take 1 tablet (1 g total) by mouth 4 (four) times daily -  with meals and at bedtime. 05/20/21   Terrilee FilesButler, Michael C, MD      Allergies    Dairy aid [tilactase], Silver, Chocolate, and Shellfish allergy    Review of Systems   Review of Systems  Physical Exam Updated Vital Signs BP 125/83 (BP Location: Left Arm)    Pulse 75    Temp 97.7 F (36.5 C) (Oral)    Resp 16    SpO2 96%  Physical Exam Vitals and nursing note reviewed.  Constitutional:      Appearance: He is well-developed. He is not diaphoretic.  HENT:     Head: Normocephalic and atraumatic.     Comments: Small, nontender, mobile, less than 1 cm cyst palpated within the midportion of the left eyebrow.  Minimal overlying erythema.  No surrounding cellulitis.  No active drainage.  Mouth/Throat:     Mouth: Mucous membranes are not dry.  Eyes:     Conjunctiva/sclera: Conjunctivae normal.  Neck:     Vascular: Normal carotid pulses. No carotid bruit or JVD.     Trachea: Trachea normal. No tracheal deviation.  Cardiovascular:     Rate and Rhythm: Normal rate and regular rhythm.     Pulses: No decreased pulses.          Radial pulses are 2+ on the right side and 2+ on the left side.     Heart sounds: Normal heart sounds, S1 normal and S2 normal. Heart sounds not distant. No murmur heard. Pulmonary:     Effort: Pulmonary effort is normal. No respiratory distress.     Breath sounds: Normal breath sounds. No wheezing, rhonchi or  rales.  Chest:     Chest wall: No tenderness.  Abdominal:     General: Bowel sounds are normal.     Palpations: Abdomen is soft.     Tenderness: There is no abdominal tenderness. There is no guarding or rebound.  Musculoskeletal:     Cervical back: Normal range of motion and neck supple. No muscular tenderness.     Right lower leg: No edema.     Left lower leg: No edema.  Skin:    General: Skin is warm and dry.     Coloration: Skin is not pale.  Neurological:     Mental Status: He is alert. Mental status is at baseline.  Psychiatric:        Mood and Affect: Mood normal.    ED Results / Procedures / Treatments   Labs (all labs ordered are listed, but only abnormal results are displayed) Labs Reviewed - No data to display  EKG EKG Interpretation  Date/Time:  Monday November 05 2021 00:29:47 EDT Ventricular Rate:  84 PR Interval:  141 QRS Duration: 73 QT Interval:  348 QTC Calculation: 412 R Axis:   105 Text Interpretation: Sinus rhythm Borderline right axis deviation No significant change was found Confirmed by Paula Libra ([id number]) on 11/05/2021 12:37:12 AM  Radiology DG Chest 2 View  Result Date: 11/05/2021 CLINICAL DATA:  Chest pain and shortness of breath. EXAM: CHEST - 2 VIEW COMPARISON:  Chest radiograph dated 10/26/2021. FINDINGS: The heart size and mediastinal contours are within normal limits. Both lungs are clear. The visualized skeletal structures are unremarkable. IMPRESSION: No active cardiopulmonary disease. Electronically Signed   By: Elgie Collard M.D.   On: 11/05/2021 00:36    Procedures Procedures    Medications Ordered in ED Medications - No data to display  ED Course/ Medical Decision Making/ A&P  Patient seen and examined. History obtained directly from patient.   Labs/EKG: Ordered EKG reviewed personally, no ischemic findings.  Given ongoing symptoms we will check CBC, BMP, troponin.  1 troponin should suffice given timing of symptoms.  Imaging:  Ordered chest x-ray personally visualized and reviewed.  Agree negative.  Medications/Fluids: Ordered: GI cocktail.   Most recent vital signs reviewed and are as follows: BP 125/83 (BP Location: Left Arm)    Pulse 75    Temp 97.7 F (36.5 C) (Oral)    Resp 16    SpO2 96%   Initial impression: Atypical chest pain, sebaceous cyst left eyebrow, mildly inflamed without obvious infection  1:58 AM Reassessment performed. Patient appears comfortable, stable.  GI cocktail did not really do much.  Labs personally reviewed and interpreted including: BC with elevated hemoglobin at 17.6, normal white blood  cell count; BMP normal; troponin normal.  Imaging personally visualized and interpreted including:  Reviewed pertinent lab work and imaging with patient at bedside. Questions answered.   Most current vital signs reviewed and are as follows: BP 125/83 (BP Location: Left Arm)    Pulse 75    Temp 97.7 F (36.5 C) (Oral)    Resp 16    SpO2 96%   Plan: Discharge to home.   Prescriptions written for: Meloxicam, Bactroban  Other home care instructions discussed: Warm compresses  ED return instructions discussed:  I encouraged patient to return to ED with severe chest pain, especially if the pain is crushing or pressure-like and spreads to the arms, back, neck, or jaw, or if they have associated sweating, vomiting, or shortness of breath with the pain, or significant pain with activity. We discussed that the evaluation here today indicates a low-risk of serious cause of chest pain, including heart trouble or a blood clot, but no evaluation is perfect and chest pain can evolve with time. The patient verbalized understanding and agreed.  I encouraged patient to follow-up with their provider in the next 48 hours for recheck.    Follow-up instructions discussed: Patient encouraged to follow-up with their PCP in 7 days.                           Medical Decision Making Amount and/or Complexity of Data  Reviewed Labs: ordered. Radiology: ordered.  Risk OTC drugs. Prescription drug management.   For this patient's complaint of chest pain, the following emergent conditions were considered on the differential diagnosis: acute coronary syndrome, pulmonary embolism, pneumothorax, myocarditis, pericardial tamponade, aortic dissection, thoracic aortic aneurysm complication, esophageal perforation.   Other causes were also considered including: gastroesophageal reflux disease, musculoskeletal pain including costochondritis, pneumonia/pleurisy, herpes zoster, pericarditis.  In regards to possibility of ACS, patient has atypical features of pain, non-ischemic and unchanged EKG and negative troponin(s). Heart score was calculated to be 0.   In regards to possibility of PE, symptoms are atypical for PE and risk profile is low, making PE low likelihood. PERC neg.   Patient's headache is related to mildly inflamed sebaceous cyst mid left eyebrow.  No significant associated fluctuance or associated cellulitis today.  Do not feel patient requires an additional course of oral antibiotics at this time.  Prescribed Bactroban, NSAIDs, warm compresses.  The patient's vital signs, pertinent lab work and imaging were reviewed and interpreted as discussed in the ED course. Hospitalization was considered for further testing, treatments, or serial exams/observation. However as patient is well-appearing, has a stable exam, and reassuring studies today, I do not feel that they warrant admission at this time. This plan was discussed with the patient who verbalizes agreement and comfort with this plan and seems reliable and able to return to the Emergency Department with worsening or changing symptoms.          Final Clinical Impression(s) / ED Diagnoses Final diagnoses:  Precordial pain  Sebaceous cyst  Acute nonintractable headache, unspecified headache type    Rx / DC Orders ED Discharge Orders           Ordered    meloxicam (MOBIC) 7.5 MG tablet  Daily        11/05/21 0156    mupirocin cream (BACTROBAN) 2 %  2 times daily        11/05/21 0156  Renne Crigler, PA-C 11/05/21 0203    Molpus, Jonny Ruiz, MD 11/05/21 [phone]

## 2021-11-05 NOTE — ED Triage Notes (Signed)
Patient arrived with complaints of left sided chest pain and shortness of breath that started yesterday at work. Seen previously for same on 3/3 and told to come back to ED if it worsens.  ?

## 2022-01-19 ENCOUNTER — Emergency Department (HOSPITAL_COMMUNITY)
Admission: EM | Admit: 2022-01-19 | Discharge: 2022-01-19 | Disposition: A | Discharge status: Home / Self Care | Payer: Self-pay | Attending: Emergency Medicine | Admitting: Emergency Medicine

## 2022-01-19 ENCOUNTER — Emergency Department (HOSPITAL_COMMUNITY): Payer: Self-pay

## 2022-01-19 ENCOUNTER — Other Ambulatory Visit: Payer: Self-pay

## 2022-01-19 ENCOUNTER — Encounter (HOSPITAL_COMMUNITY): Payer: Self-pay | Admitting: *Deleted

## 2022-01-19 DIAGNOSIS — R1030 Lower abdominal pain, unspecified: Secondary | ICD-10-CM | POA: Insufficient documentation

## 2022-01-19 DIAGNOSIS — R109 Unspecified abdominal pain: Secondary | ICD-10-CM

## 2022-01-19 LAB — CBC
HCT: 47.7 % (ref 39.0–52.0)
Hemoglobin: 17.3 g/dL — ABNORMAL HIGH (ref 13.0–17.0)
MCH: 29.8 pg (ref 26.0–34.0)
MCHC: 36.3 g/dL — ABNORMAL HIGH (ref 30.0–36.0)
MCV: 82.2 fL (ref 80.0–100.0)
Platelets: 270 10*3/uL (ref 150–400)
RBC: 5.8 MIL/uL (ref 4.22–5.81)
RDW: 13.7 % (ref 11.5–15.5)
WBC: 9.5 10*3/uL (ref 4.0–10.5)
nRBC: 0 % (ref 0.0–0.2)

## 2022-01-19 LAB — URINALYSIS, ROUTINE W REFLEX MICROSCOPIC
Bilirubin Urine: NEGATIVE
Glucose, UA: NEGATIVE mg/dL
Hgb urine dipstick: NEGATIVE
Ketones, ur: NEGATIVE mg/dL
Leukocytes,Ua: NEGATIVE
Nitrite: NEGATIVE
Protein, ur: NEGATIVE mg/dL
Specific Gravity, Urine: 1.016 (ref 1.005–1.030)
pH: 5 (ref 5.0–8.0)

## 2022-01-19 LAB — COMPREHENSIVE METABOLIC PANEL
ALT: 9 U/L (ref 0–44)
AST: 18 U/L (ref 15–41)
Albumin: 4.3 g/dL (ref 3.5–5.0)
Alkaline Phosphatase: 54 U/L (ref 38–126)
Anion gap: 5 (ref 5–15)
BUN: 11 mg/dL (ref 6–20)
CO2: 27 mmol/L (ref 22–32)
Calcium: 9.1 mg/dL (ref 8.9–10.3)
Chloride: 107 mmol/L (ref 98–111)
Creatinine, Ser: 1.05 mg/dL (ref 0.61–1.24)
GFR, Estimated: >60 mL/min (ref >60)
Glucose, Bld: 96 mg/dL (ref 70–99)
Potassium: 4.2 mmol/L (ref 3.5–5.1)
Sodium: 139 mmol/L (ref 135–145)
Total Bilirubin: 0.7 mg/dL (ref 0.3–1.2)
Total Protein: 7.3 g/dL (ref 6.5–8.1)

## 2022-01-19 LAB — LIPASE, BLOOD: Lipase: 79 U/L — ABNORMAL HIGH (ref 11–51)

## 2022-01-19 MED ORDER — POLYETHYLENE GLYCOL 3350 17 G PO PACK: 17.0000 g | PACK | Freq: Every day | ORAL | 0 refills | Status: AC

## 2022-01-19 MED ORDER — IOHEXOL 300 MG/ML  SOLN
100.0000 mL | Freq: Once | INTRAMUSCULAR | Status: AC | PRN
  Administered 2022-01-19: 100 mL via INTRAVENOUS

## 2022-01-19 MED ORDER — LACTULOSE 10 GM/15ML PO SOLN
20.0000 g | Freq: Once | ORAL | Status: AC
  Administered 2022-01-19: 20 g via ORAL
  Filled 2022-01-19: qty 30

## 2022-01-19 MED ORDER — SODIUM CHLORIDE (PF) 0.9 % IJ SOLN
INTRAMUSCULAR | Status: AC
  Filled 2022-01-19: qty 50

## 2022-01-19 NOTE — Discharge Instructions (Addendum)
Call your primary care doctor or specialist as discussed in the next 2-3 days.   Return immediately back to the ER if:  Your symptoms worsen within the next 12-24 hours. You develop new symptoms such as new fevers, persistent vomiting, new pain, shortness of breath, or new weakness or numbness, or if you have any other concerns.  

## 2022-01-19 NOTE — ED Provider Notes (Signed)
[facility]-EMERGENCY DEPT Provider Note   CSN: [id number] Arrival date & time: 01/19/22  1115     History  Chief Complaint  Patient presents with   Abdominal Pain    Drew [name] is a 26 y.o. male.  Patient presents ER chief complaint of abdominal pain with constipation.  Describes it as a aching pain in the lower mid abdomen.  Symptoms ongoing for about a week.  Denies fevers or cough denies vomiting denies diarrhea.  He states has been constipated for about a week as well.      Home Medications Prior to Admission medications   Medication Sig Start Date End Date Taking? Authorizing Provider  polyethylene glycol (MIRALAX) 17 g packet Take 17 g by mouth daily. 01/19/22  Yes Sobia Karger, Eustace MooreJoshua S, MD  doxycycline (VIBRAMYCIN) 100 MG capsule Take 1 capsule (100 mg total) by mouth 2 (two) times daily. 10/26/21   Bethann BerkshireZammit, Joseph, MD  esomeprazole (NEXIUM) 20 MG packet Take 20 mg by mouth daily before breakfast. 05/20/21   Terrilee FilesButler, Michael C, MD  ibuprofen (ADVIL) 800 MG tablet Take 1 tablet (800 mg total) by mouth every 8 (eight) hours as needed for moderate pain. 10/26/21   Bethann BerkshireZammit, Joseph, MD  meloxicam (MOBIC) 7.5 MG tablet Take 1 tablet (7.5 mg total) by mouth daily. 11/05/21   Renne CriglerGeiple, Alexander Mcauley, PA-C  mupirocin cream (BACTROBAN) 2 % Apply 1 application. topically 2 (two) times daily. 11/05/21   Renne CriglerGeiple, Eliska Hamil, PA-C  ondansetron (ZOFRAN ODT) 4 MG disintegrating tablet Take 1 tablet (4 mg total) by mouth every 8 (eight) hours as needed for nausea or vomiting. 03/23/21   Arthor CaptainHarris, Abigail, PA-C  ondansetron (ZOFRAN) 4 MG tablet Take 1 tablet (4 mg total) by mouth every 6 (six) hours. 12/10/20   Couture, Cortni S, PA-C  oseltamivir (TAMIFLU) 75 MG capsule Take 1 capsule (75 mg total) by mouth every 12 (twelve) hours. 12/10/20   Couture, Cortni S, PA-C  sucralfate (CARAFATE) 1 g tablet Take 1 tablet (1 g total) by mouth 4 (four) times daily -  with meals and at bedtime. 05/20/21    Terrilee FilesButler, Michael C, MD      Allergies    Dairy aid [tilactase], Silver, Chocolate, and Shellfish allergy    Review of Systems   Review of Systems  Constitutional:  Negative for fever.  HENT:  Negative for ear pain and sore throat.   Eyes:  Negative for pain.  Respiratory:  Negative for cough.   Cardiovascular:  Negative for chest pain.  Gastrointestinal:  Positive for abdominal pain.  Genitourinary:  Negative for flank pain.  Musculoskeletal:  Negative for back pain.  Skin:  Negative for color change and rash.  Neurological:  Negative for syncope.  All other systems reviewed and are negative.  Physical Exam Updated Vital Signs BP 129/82   Pulse 66   Temp 98.1 F (36.7 C)   Resp 18   Ht 5\' 7"  (1.702 m)   Wt 74.8 kg   SpO2 96%   BMI 25.84 kg/m  Physical Exam Constitutional:      Appearance: He is well-developed.  HENT:     Head: Normocephalic.     Nose: Nose normal.  Eyes:     Extraocular Movements: Extraocular movements intact.  Cardiovascular:     Rate and Rhythm: Normal rate.  Pulmonary:     Effort: Pulmonary effort is normal.  Abdominal:     Tenderness: There is abdominal tenderness.     Comments: Mid lower abdominal  tenderness no guarding or rebound.  Skin:    Coloration: Skin is not jaundiced.  Neurological:     Mental Status: He is alert. Mental status is at baseline.    ED Results / Procedures / Treatments   Labs (all labs ordered are listed, but only abnormal results are displayed) Labs Reviewed  LIPASE, BLOOD - Abnormal; Notable for the following components:      Result Value   Lipase 79 (*)    All other components within normal limits  CBC - Abnormal; Notable for the following components:   Hemoglobin 17.3 (*)    MCHC 36.3 (*)    All other components within normal limits  COMPREHENSIVE METABOLIC PANEL  URINALYSIS, ROUTINE W REFLEX MICROSCOPIC    EKG None  Radiology CT Abdomen Pelvis W Contrast  Result Date: 01/19/2022 CLINICAL DATA:   Abdominal pain.  Dark stools. EXAM: CT ABDOMEN AND PELVIS WITH CONTRAST TECHNIQUE: Multidetector CT imaging of the abdomen and pelvis was performed using the standard protocol following bolus administration of intravenous contrast. RADIATION DOSE REDUCTION: This exam was performed according to the departmental dose-optimization program which includes automated exposure control, adjustment of the mA and/or kV according to patient size and/or use of iterative reconstruction technique. CONTRAST:  OMNIPAQUE IOHEXOL 300 MG/ML  SOLN COMPARISON:  03/23/2021. FINDINGS: Lower chest: Clear lung bases. Hepatobiliary: No focal liver abnormality is seen. No gallstones, gallbladder wall thickening, or biliary dilatation. Pancreas: Unremarkable. No pancreatic ductal dilatation or surrounding inflammatory changes. Spleen: Normal in size without focal abnormality. Adrenals/Urinary Tract: Adrenal glands are unremarkable. Kidneys are normal, without renal calculi, focal lesion, or hydronephrosis. Bladder is unremarkable. Stomach/Bowel: Stomach is within normal limits. Appendix appears normal. No evidence of bowel wall thickening, distention, or inflammatory changes. Colonic stool burden does not appear significantly increased. Vascular/Lymphatic: No significant vascular findings are present. No enlarged abdominal or pelvic lymph nodes. Reproductive: Unremarkable. Other: No abdominal wall hernia or abnormality. No abdominopelvic ascites. Musculoskeletal: Slight depressions of the upper endplates T12 and L1, new since the prior exam. No other skeletal abnormality. IMPRESSION: 1. Slight depressions of the upper endplates of T12 and L1, which may be early Schmorl's nodes. This was not evident on the prior CT. Please correlate clinically within the history of back injury or current back pain. 2. Exam otherwise within normal limits. Electronically Signed   By: Amie Portland M.D.   On: 01/19/2022 14:19    Procedures Procedures     Medications Ordered in ED Medications  sodium chloride (PF) 0.9 % injection (has no administration in time range)  lactulose (CHRONULAC) 10 GM/15ML solution 20 g (20 g Oral Given 01/19/22 1203)  iohexol (OMNIPAQUE) 300 MG/ML solution 100 mL (100 mLs Intravenous Contrast Given 01/19/22 1402)    ED Course/ Medical Decision Making/ A&P                           Medical Decision Making Amount and/or Complexity of Data Reviewed Labs: ordered. Radiology: ordered.  Risk Prescription drug management.   Chart review shows prior visits for abdominal pain several months ago.  Cardiac monitor showing sinus rhythm no ST elevations depressions.  Diagnostic test included labs CBC chemistry unremarkable urinalysis negative lipase mildly elevated at 79.  Patient given lactulose here as he states he is constipated.  Recommending continue lactulose at home.  Advised immediate return for worsening symptoms fevers or any additional concerns otherwise CT imaging is unremarkable in relation to his chief complaint,  patient discharged home in stable condition.        Final Clinical Impression(s) / ED Diagnoses Final diagnoses:  Abdominal pain, unspecified abdominal location    Rx / DC Orders ED Discharge Orders          Ordered    polyethylene glycol (MIRALAX) 17 g packet  Daily        01/19/22 1429              Cheryll Cockayne, MD 01/19/22 1429

## 2022-01-19 NOTE — ED Triage Notes (Signed)
Dark stools with blood x 1 week, some constipation reported. Mid abd pain

## 2022-02-07 ENCOUNTER — Encounter (HOSPITAL_COMMUNITY): Payer: Self-pay

## 2022-02-07 ENCOUNTER — Other Ambulatory Visit: Payer: Self-pay

## 2022-02-07 ENCOUNTER — Emergency Department (HOSPITAL_COMMUNITY)
Admission: EM | Admit: 2022-02-07 | Discharge: 2022-02-07 | Disposition: A | Discharge status: Home / Self Care | Payer: Self-pay | Attending: Emergency Medicine | Admitting: Emergency Medicine

## 2022-02-07 DIAGNOSIS — R112 Nausea with vomiting, unspecified: Secondary | ICD-10-CM | POA: Insufficient documentation

## 2022-02-07 DIAGNOSIS — R300 Dysuria: Secondary | ICD-10-CM | POA: Insufficient documentation

## 2022-02-07 DIAGNOSIS — R5383 Other fatigue: Secondary | ICD-10-CM | POA: Insufficient documentation

## 2022-02-07 DIAGNOSIS — R531 Weakness: Secondary | ICD-10-CM | POA: Insufficient documentation

## 2022-02-07 LAB — URINALYSIS, ROUTINE W REFLEX MICROSCOPIC
Bilirubin Urine: NEGATIVE
Glucose, UA: NEGATIVE mg/dL
Hgb urine dipstick: NEGATIVE
Ketones, ur: NEGATIVE mg/dL
Leukocytes,Ua: NEGATIVE
Nitrite: NEGATIVE
Protein, ur: NEGATIVE mg/dL
Specific Gravity, Urine: 1.013 (ref 1.005–1.030)
pH: 6 (ref 5.0–8.0)

## 2022-02-07 NOTE — ED Triage Notes (Addendum)
Pt states had a couple of vomiting episodes yesterday "but was too weak to come in" no vomiting today. Denies any symptoms today. Pt states "I just need a work note for yesterday"

## 2022-02-07 NOTE — Discharge Instructions (Addendum)
Please follow-up with primary care doctor.  Come back to ER if you are having recurrent vomiting, abdominal pain or other new concerning symptom.

## 2022-02-07 NOTE — ED Notes (Signed)
An After Visit Summary was printed and given to the patient. Discharge instructions given and no further questions at this time.  

## 2022-02-07 NOTE — ED Provider Notes (Signed)
[facility]-EMERGENCY DEPT Provider Note   CSN: [id number] Arrival date & time: 02/07/22  1840     History  Chief Complaint  Patient presents with   Emesis    Drew [name] is a 26 y.o. male.  Presents emerged department due to concern for nausea and vomiting.  Patient states that all of his symptoms occurred yesterday had some nausea and a couple episodes of vomiting.  Felt extremely weak, fatigued.  States that today he is feeling much better, the nausea and vomiting has completely gone away.  He has had some burning with urination over the past day.  He is not currently sexually active and reports it has been a long time since he has had any sort of sexual interaction and was not aware of any sort of past STD exposures.  No fevers or chills.  States that he came to the ER today to get a work note.  HPI     Home Medications Prior to Admission medications   Medication Sig Start Date End Date Taking? Authorizing Provider  doxycycline (VIBRAMYCIN) 100 MG capsule Take 1 capsule (100 mg total) by mouth 2 (two) times daily. 10/26/21   Bethann Berkshire, MD  esomeprazole (NEXIUM) 20 MG packet Take 20 mg by mouth daily before breakfast. 05/20/21   Terrilee Files, MD  ibuprofen (ADVIL) 800 MG tablet Take 1 tablet (800 mg total) by mouth every 8 (eight) hours as needed for moderate pain. 10/26/21   Bethann Berkshire, MD  meloxicam (MOBIC) 7.5 MG tablet Take 1 tablet (7.5 mg total) by mouth daily. 11/05/21   Renne Crigler, PA-C  mupirocin cream (BACTROBAN) 2 % Apply 1 application. topically 2 (two) times daily. 11/05/21   Renne Crigler, PA-C  ondansetron (ZOFRAN ODT) 4 MG disintegrating tablet Take 1 tablet (4 mg total) by mouth every 8 (eight) hours as needed for nausea or vomiting. 03/23/21   Arthor Captain, PA-C  ondansetron (ZOFRAN) 4 MG tablet Take 1 tablet (4 mg total) by mouth every 6 (six) hours. 12/10/20   Couture, Cortni S, PA-C  oseltamivir (TAMIFLU) 75 MG capsule  Take 1 capsule (75 mg total) by mouth every 12 (twelve) hours. 12/10/20   Couture, Cortni S, PA-C  polyethylene glycol (MIRALAX) 17 g packet Take 17 g by mouth daily. 01/19/22   Cheryll Cockayne, MD  sucralfate (CARAFATE) 1 g tablet Take 1 tablet (1 g total) by mouth 4 (four) times daily -  with meals and at bedtime. 05/20/21   Terrilee Files, MD      Allergies    Dairy aid [tilactase], Silver, Chocolate, and Shellfish allergy    Review of Systems   Review of Systems  Constitutional:  Negative for chills and fever.  HENT:  Negative for ear pain and sore throat.   Eyes:  Negative for pain and visual disturbance.  Respiratory:  Negative for cough and shortness of breath.   Cardiovascular:  Negative for chest pain and palpitations.  Gastrointestinal:  Positive for nausea and vomiting. Negative for abdominal pain.  Genitourinary:  Positive for dysuria. Negative for hematuria.  Musculoskeletal:  Negative for arthralgias and back pain.  Skin:  Negative for color change and rash.  Neurological:  Negative for seizures and syncope.  All other systems reviewed and are negative.   Physical Exam Updated Vital Signs BP 132/75 (BP Location: Right Arm)   Pulse 93   Temp 98.3 F (36.8 C) (Oral)   Resp 16   Ht 5\' 7"  (1.702  m)   Wt 74.8 kg   SpO2 96%   BMI 25.83 kg/m  Physical Exam Vitals and nursing note reviewed.  Constitutional:      General: He is not in acute distress.    Appearance: He is well-developed.  HENT:     Head: Normocephalic and atraumatic.  Eyes:     Conjunctiva/sclera: Conjunctivae normal.  Cardiovascular:     Rate and Rhythm: Normal rate and regular rhythm.     Pulses: Normal pulses.  Pulmonary:     Effort: Pulmonary effort is normal. No respiratory distress.  Abdominal:     Palpations: Abdomen is soft.     Tenderness: There is no abdominal tenderness.  Musculoskeletal:        General: No swelling, deformity or signs of injury.     Cervical back: Neck supple.   Skin:    General: Skin is warm and dry.     Capillary Refill: Capillary refill takes less than 2 seconds.  Neurological:     General: No focal deficit present.     Mental Status: He is alert.  Psychiatric:        Mood and Affect: Mood normal.     ED Results / Procedures / Treatments   Labs (all labs ordered are listed, but only abnormal results are displayed) Labs Reviewed  URINALYSIS, ROUTINE W REFLEX MICROSCOPIC    EKG None  Radiology No results found.  Procedures Procedures    Medications Ordered in ED Medications - No data to display  ED Course/ Medical Decision Making/ A&P                           Medical Decision Making Amount and/or Complexity of Data Reviewed Labs: ordered.  26 year old male presenting for nausea and vomiting.  His symptoms have all resolved.  Currently denies any ongoing nausea or vomiting and his abdomen is soft and nontender.  The only symptom that he continued endorsing was some dysuria.  His urinalysis was negative for infection.  Patient was provided a work note as requested and he was discharged home. Vitals wnl.    After the discussed management above, the patient was determined to be safe for discharge.  The patient was in agreement with this plan and all questions regarding their care were answered.  ED return precautions were discussed and the patient will return to the ED with any significant worsening of condition.         Final Clinical Impression(s) / ED Diagnoses Final diagnoses:  Nausea and vomiting, unspecified vomiting type    Rx / DC Orders ED Discharge Orders     None         Milagros Loll, MD 02/08/22 [phone]

## 2022-06-29 ENCOUNTER — Emergency Department (HOSPITAL_COMMUNITY): Payer: Self-pay

## 2022-06-29 ENCOUNTER — Emergency Department (HOSPITAL_COMMUNITY)
Admission: EM | Admit: 2022-06-29 | Discharge: 2022-06-29 | Disposition: A | Discharge status: Home / Self Care | Payer: Self-pay | Attending: Emergency Medicine | Admitting: Emergency Medicine

## 2022-06-29 ENCOUNTER — Encounter (HOSPITAL_COMMUNITY): Payer: Self-pay | Admitting: *Deleted

## 2022-06-29 DIAGNOSIS — R079 Chest pain, unspecified: Secondary | ICD-10-CM | POA: Insufficient documentation

## 2022-06-29 DIAGNOSIS — R112 Nausea with vomiting, unspecified: Secondary | ICD-10-CM | POA: Insufficient documentation

## 2022-06-29 DIAGNOSIS — J45909 Unspecified asthma, uncomplicated: Secondary | ICD-10-CM | POA: Insufficient documentation

## 2022-06-29 DIAGNOSIS — R1033 Periumbilical pain: Secondary | ICD-10-CM | POA: Insufficient documentation

## 2022-06-29 LAB — CBC WITH DIFFERENTIAL/PLATELET
Abs Immature Granulocytes: 0.03 10*3/uL (ref 0.00–0.07)
Basophils Absolute: 0.1 10*3/uL (ref 0.0–0.1)
Basophils Relative: 1 %
Eosinophils Absolute: 0.1 10*3/uL (ref 0.0–0.5)
Eosinophils Relative: 1 %
HCT: 43.8 % (ref 39.0–52.0)
Hemoglobin: 15.7 g/dL (ref 13.0–17.0)
Immature Granulocytes: 0 %
Lymphocytes Relative: 20 %
Lymphs Abs: 1.7 10*3/uL (ref 0.7–4.0)
MCH: 29.6 pg (ref 26.0–34.0)
MCHC: 35.8 g/dL (ref 30.0–36.0)
MCV: 82.6 fL (ref 80.0–100.0)
Monocytes Absolute: 0.7 10*3/uL (ref 0.1–1.0)
Monocytes Relative: 9 %
Neutro Abs: 5.8 10*3/uL (ref 1.7–7.7)
Neutrophils Relative %: 69 %
Platelets: 244 10*3/uL (ref 150–400)
RBC: 5.3 MIL/uL (ref 4.22–5.81)
RDW: 14.3 % (ref 11.5–15.5)
WBC: 8.5 10*3/uL (ref 4.0–10.5)
nRBC: 0 % (ref 0.0–0.2)

## 2022-06-29 LAB — COMPREHENSIVE METABOLIC PANEL
ALT: 10 U/L (ref 0–44)
AST: 14 U/L — ABNORMAL LOW (ref 15–41)
Albumin: 3.8 g/dL (ref 3.5–5.0)
Alkaline Phosphatase: 49 U/L (ref 38–126)
Anion gap: 7 (ref 5–15)
BUN: 8 mg/dL (ref 6–20)
CO2: 28 mmol/L (ref 22–32)
Calcium: 9.2 mg/dL (ref 8.9–10.3)
Chloride: 104 mmol/L (ref 98–111)
Creatinine, Ser: 0.88 mg/dL (ref 0.61–1.24)
GFR, Estimated: >60 mL/min (ref >60)
Glucose, Bld: 103 mg/dL — ABNORMAL HIGH (ref 70–99)
Potassium: 4 mmol/L (ref 3.5–5.1)
Sodium: 139 mmol/L (ref 135–145)
Total Bilirubin: 0.7 mg/dL (ref 0.3–1.2)
Total Protein: 6.5 g/dL (ref 6.5–8.1)

## 2022-06-29 LAB — TROPONIN I (HIGH SENSITIVITY): Troponin I (High Sensitivity): 3 ng/L (ref <18)

## 2022-06-29 LAB — LIPASE, BLOOD: Lipase: 35 U/L (ref 11–51)

## 2022-06-29 MED ORDER — ONDANSETRON 8 MG PO TBDP: 8.0000 mg | ORAL_TABLET | Freq: Three times a day (TID) | ORAL | 0 refills | Status: AC | PRN

## 2022-06-29 MED ORDER — ONDANSETRON HCL 4 MG/2ML IJ SOLN
4.0000 mg | Freq: Once | INTRAMUSCULAR | Status: AC
  Administered 2022-06-29: 4 mg via INTRAVENOUS
  Filled 2022-06-29: qty 2

## 2022-06-29 MED ORDER — SODIUM CHLORIDE 0.9 % IV BOLUS
1000.0000 mL | Freq: Once | INTRAVENOUS | Status: AC
  Administered 2022-06-29: 1000 mL via INTRAVENOUS

## 2022-06-29 NOTE — ED Triage Notes (Signed)
Here from home for NV, related to suspicious food at [facility] Wednesday night, "others got sick", NV since Wednesday, denies blood, fever, diarrhea. Mentions sob and CP. V x9 in last 24 hrs. Vapes tobacco, occasional marijuana. Alert, NAD, calm, interactive.

## 2022-06-29 NOTE — Discharge Instructions (Signed)
Drink plenty of fluids.  Take Zofran every 8 hours as needed for nausea.  Follow-up with your doctor concerning symptoms on Monday.  Return to the ED for new or concerning symptoms.

## 2022-06-29 NOTE — ED Provider Notes (Signed)
[facility]-EMERGENCY DEPT Provider Note   CSN: [id number] Arrival date & time: 06/29/22  1100     History  Chief Complaint  Patient presents with   Emesis    Drew [name] is a 26 y.o. male.   Emesis    Patient with medical history of asthma presents today due to nausea, vomiting and chest pain.  Patient states he has been having up to 8 episodes of clear vomit daily for the last 3 days after eating a cookout, other people in his household are sick as well.  He is having intermittent abdominal pain which is crampy and periumbilical.  Denies any diarrhea or blood in the stool.  States he is also been having intermittent precordial chest pain which comes and goes.  It feels sharp, does not radiate elsewhere.  Denies any history of PE, recent travel or surgeries.  No hemoptysis.  He has not tried anything at home for his symptoms.  Home Medications Prior to Admission medications   Medication Sig Start Date End Date Taking? Authorizing Provider  ondansetron (ZOFRAN-ODT) 8 MG disintegrating tablet Take 1 tablet (8 mg total) by mouth every 8 (eight) hours as needed for nausea or vomiting. 06/29/22  Yes Theron Arista, PA-C  doxycycline (VIBRAMYCIN) 100 MG capsule Take 1 capsule (100 mg total) by mouth 2 (two) times daily. 10/26/21   Bethann Berkshire, MD  esomeprazole (NEXIUM) 20 MG packet Take 20 mg by mouth daily before breakfast. 05/20/21   Terrilee Files, MD  ibuprofen (ADVIL) 800 MG tablet Take 1 tablet (800 mg total) by mouth every 8 (eight) hours as needed for moderate pain. 10/26/21   Bethann Berkshire, MD  meloxicam (MOBIC) 7.5 MG tablet Take 1 tablet (7.5 mg total) by mouth daily. 11/05/21   Renne Crigler, PA-C  mupirocin cream (BACTROBAN) 2 % Apply 1 application. topically 2 (two) times daily. 11/05/21   Renne Crigler, PA-C  ondansetron (ZOFRAN) 4 MG tablet Take 1 tablet (4 mg total) by mouth every 6 (six) hours. 12/10/20   Couture, Cortni S, PA-C  oseltamivir  (TAMIFLU) 75 MG capsule Take 1 capsule (75 mg total) by mouth every 12 (twelve) hours. 12/10/20   Couture, Cortni S, PA-C  polyethylene glycol (MIRALAX) 17 g packet Take 17 g by mouth daily. 01/19/22   Cheryll Cockayne, MD  sucralfate (CARAFATE) 1 g tablet Take 1 tablet (1 g total) by mouth 4 (four) times daily -  with meals and at bedtime. 05/20/21   Terrilee Files, MD      Allergies    Dairy aid [tilactase], Silver, Chocolate, and Shellfish allergy    Review of Systems   Review of Systems  Gastrointestinal:  Positive for vomiting.    Physical Exam Updated Vital Signs BP 115/83   Pulse 65   Temp 99 F (37.2 C) (Oral)   Resp 18   Ht 5\' 8"  (1.727 m)   Wt 76.2 kg   SpO2 99%   BMI 25.54 kg/m  Physical Exam Vitals and nursing note reviewed. Exam conducted with a chaperone present.  Constitutional:      Appearance: Normal appearance.  HENT:     Head: Normocephalic and atraumatic.  Eyes:     General: No scleral icterus.       Right eye: No discharge.        Left eye: No discharge.     Extraocular Movements: Extraocular movements intact.     Pupils: Pupils are equal, round, and reactive to light.  Cardiovascular:     Rate and Rhythm: Normal rate and regular rhythm.     Pulses: Normal pulses.     Heart sounds: Normal heart sounds. No murmur heard.    No friction rub. No gallop.  Pulmonary:     Effort: Pulmonary effort is normal. No respiratory distress.     Breath sounds: Normal breath sounds.  Abdominal:     General: Abdomen is flat. Bowel sounds are normal. There is no distension.     Palpations: Abdomen is soft.     Tenderness: There is abdominal tenderness.     Comments: Very mild periumbilical tenderness without rigidity or guarding.  Skin:    General: Skin is warm and dry.     Coloration: Skin is not jaundiced.  Neurological:     Mental Status: He is alert. Mental status is at baseline.     Coordination: Coordination normal.     ED Results / Procedures /  Treatments   Labs (all labs ordered are listed, but only abnormal results are displayed) Labs Reviewed  COMPREHENSIVE METABOLIC PANEL - Abnormal; Notable for the following components:      Result Value   Glucose, Bld 103 (*)    AST 14 (*)    All other components within normal limits  CBC WITH DIFFERENTIAL/PLATELET  LIPASE, BLOOD  TROPONIN I (HIGH SENSITIVITY)    EKG None  Radiology DG Chest 2 View  Result Date: 06/29/2022 CLINICAL DATA:  Chest pain and shortness of breath. Nausea and vomiting. EXAM: CHEST - 2 VIEW COMPARISON:  11/05/2021 FINDINGS: The heart size and mediastinal contours are within normal limits. Both lungs are clear. The visualized skeletal structures are unremarkable. IMPRESSION: Normal exam. Electronically Signed   By: Danae Orleans M.D.   On: 06/29/2022 13:11    Procedures Procedures    Medications Ordered in ED Medications  sodium chloride 0.9 % bolus 1,000 mL (1,000 mLs Intravenous New Bag/Given 06/29/22 1309)  ondansetron (ZOFRAN) injection 4 mg (4 mg Intravenous Given 06/29/22 1308)    ED Course/ Medical Decision Making/ A&P                           Medical Decision Making Amount and/or Complexity of Data Reviewed Labs: ordered. Radiology: ordered.  Risk Prescription drug management.   Patient presents due to nausea vomiting and chest pain.  Differential includes but not limited to food poisoning, AKI, gross likely derangement, dehydration, sepsis, ACS, PE, pneumonia, arrhythmia.  Patient is well-appearing on exam.  Generalized abdominal tenderness without rigidity or guarding.  Abdomen is nonperitoneal.  Lungs are clear to auscultation, upper and lower extremity pulses are symmetric bilaterally.  I ordered, viewed and interpreted laboratory work-up. CBC without leukocytosis or anemia. CMP without gross electrolyte derangement or AKI or transaminitis. Lipase within normal limits, not consistent pancreatitis. Troponin 3.  Chest x-ray shows  normal cardiac silhouette from interpretation radiographs radiologist that there is no acute process.  Patient is on cardiac monitoring in sinus rhythm.  EKG shows sinus rhythm, no specific changes compared to previous EKG otherwise T wave inversions in the inferior leads.  I ordered fluids, Zofran.  I reviewed the patient is feeling improved.  I considered ACS but given negative troponin and no new ischemic findings on EKG I think that is unlikely.  No signs pancreatitis, AKI, gross electrolyte derangement or emergent process in his laboratory work-up.  Patient stable and appropriate outpatient follow-up.  Return precautions discussed.  Final Clinical Impression(s) / ED Diagnoses Final diagnoses:  Nausea and vomiting, unspecified vomiting type    Rx / DC Orders ED Discharge Orders          Ordered    ondansetron (ZOFRAN-ODT) 8 MG disintegrating tablet  Every 8 hours PRN        06/29/22 1425              Sherrill Raring, PA-C 06/29/22 1429    Lacretia Leigh, MD 06/29/22 1506
